# Patient Record
Sex: Male | Born: 1970
Health system: Southern US, Community
[De-identification: ages and names within clinical notes are randomized; demographics above are authoritative.]

## PROBLEM LIST (undated history)

## (undated) DIAGNOSIS — K219 Gastro-esophageal reflux disease without esophagitis: Secondary | ICD-10-CM

## (undated) DIAGNOSIS — E785 Hyperlipidemia, unspecified: Secondary | ICD-10-CM

## (undated) DIAGNOSIS — F419 Anxiety disorder, unspecified: Secondary | ICD-10-CM

## (undated) DIAGNOSIS — R509 Fever, unspecified: Secondary | ICD-10-CM

## (undated) DIAGNOSIS — L519 Erythema multiforme, unspecified: Principal | ICD-10-CM

## (undated) DIAGNOSIS — I10 Essential (primary) hypertension: Secondary | ICD-10-CM

## (undated) HISTORY — DX: Anxiety disorder, unspecified: F41.9

## (undated) HISTORY — DX: Erythema multiforme, unspecified: L51.9

## (undated) HISTORY — PX: WISDOM TOOTH EXTRACTION: SHX21

## (undated) HISTORY — DX: Fever, unspecified: R50.9

## (undated) HISTORY — DX: Hyperlipidemia, unspecified: E78.5

## (undated) HISTORY — PX: MOUTH SURGERY: SHX715

## (undated) HISTORY — DX: Gastro-esophageal reflux disease without esophagitis: K21.9

## (undated) HISTORY — DX: Essential (primary) hypertension: I10

## (undated) HISTORY — PX: EYE SURGERY: SHX253

---

## 2007-07-11 HISTORY — PX: LASIK: SHX215

## 2008-04-20 ENCOUNTER — Ambulatory Visit: Payer: Self-pay | Admitting: Internal Medicine

## 2008-10-19 ENCOUNTER — Ambulatory Visit: Payer: Self-pay | Admitting: Internal Medicine

## 2008-10-22 ENCOUNTER — Ambulatory Visit: Payer: Self-pay | Admitting: Internal Medicine

## 2008-10-22 ENCOUNTER — Encounter: Payer: Self-pay | Admitting: *Deleted

## 2008-10-22 DIAGNOSIS — E782 Mixed hyperlipidemia: Secondary | ICD-10-CM | POA: Insufficient documentation

## 2008-11-02 ENCOUNTER — Ambulatory Visit: Payer: Self-pay | Admitting: Internal Medicine

## 2008-12-29 ENCOUNTER — Ambulatory Visit: Payer: Self-pay | Admitting: Internal Medicine

## 2009-01-04 ENCOUNTER — Ambulatory Visit: Payer: Self-pay | Admitting: Cardiovascular Disease

## 2009-01-04 LAB — CONVERTED CEMR LAB
ALT: 37 units/L (ref 0–53)
Albumin: 4.2 g/dL (ref 3.5–5.2)
GFR calc non Af Amer: 100.95 mL/min (ref >60)
Glucose, Bld: 84 mg/dL (ref 70–99)
HDL: 57 mg/dL (ref >39.00)
Potassium: 4 meq/L (ref 3.5–5.1)
Sodium: 142 meq/L (ref 135–145)
Total Bilirubin: 1.2 mg/dL (ref 0.3–1.2)
Total Protein: 7.3 g/dL (ref 6.0–8.3)
Triglycerides: 198 mg/dL — ABNORMAL HIGH (ref 0.0–149.0)
VLDL: 39.6 mg/dL (ref 0.0–40.0)

## 2009-01-13 ENCOUNTER — Ambulatory Visit: Payer: Self-pay | Admitting: Internal Medicine

## 2009-04-23 ENCOUNTER — Ambulatory Visit: Payer: Self-pay | Admitting: Internal Medicine

## 2009-09-06 ENCOUNTER — Ambulatory Visit: Payer: Self-pay | Admitting: Internal Medicine

## 2009-12-17 ENCOUNTER — Ambulatory Visit: Payer: Self-pay | Admitting: Internal Medicine

## 2010-04-28 ENCOUNTER — Ambulatory Visit: Payer: Self-pay | Admitting: Internal Medicine

## 2010-05-16 ENCOUNTER — Ambulatory Visit: Payer: Self-pay | Admitting: Internal Medicine

## 2010-07-22 ENCOUNTER — Ambulatory Visit
Admission: RE | Admit: 2010-07-22 | Discharge: 2010-07-22 | Payer: Self-pay | Source: Home / Self Care | Attending: Internal Medicine | Admitting: Internal Medicine

## 2010-08-12 ENCOUNTER — Other Ambulatory Visit: Payer: Self-pay | Admitting: Internal Medicine

## 2010-08-18 ENCOUNTER — Other Ambulatory Visit: Payer: BC Managed Care – PPO | Admitting: Internal Medicine

## 2010-10-04 ENCOUNTER — Other Ambulatory Visit: Payer: Self-pay | Admitting: Cardiology

## 2010-10-06 ENCOUNTER — Other Ambulatory Visit: Payer: Self-pay | Admitting: *Deleted

## 2010-10-06 MED ORDER — ATORVASTATIN CALCIUM 40 MG PO TABS: 40.0000 mg | ORAL_TABLET | Freq: Every day | ORAL | Status: DC

## 2010-10-06 NOTE — Telephone Encounter (Signed)
[address] °

## 2011-04-12 ENCOUNTER — Other Ambulatory Visit: Payer: Self-pay

## 2011-04-12 MED ORDER — FENOFIBRATE 145 MG PO TABS: 145.0000 mg | ORAL_TABLET | Freq: Every day | ORAL | Status: DC

## 2011-04-28 ENCOUNTER — Other Ambulatory Visit: Payer: Self-pay

## 2011-04-28 MED ORDER — ZOLPIDEM TARTRATE 10 MG PO TABS: 10.0000 mg | ORAL_TABLET | Freq: Every evening | ORAL | Status: DC | PRN

## 2011-05-02 ENCOUNTER — Encounter: Payer: Self-pay | Admitting: Internal Medicine

## 2011-05-04 ENCOUNTER — Encounter: Payer: BC Managed Care – PPO | Admitting: Internal Medicine

## 2011-05-16 ENCOUNTER — Other Ambulatory Visit: Payer: Self-pay

## 2011-05-16 MED ORDER — ATORVASTATIN CALCIUM 40 MG PO TABS: 40.0000 mg | ORAL_TABLET | Freq: Every day | ORAL | Status: DC

## 2011-05-18 ENCOUNTER — Encounter: Payer: Self-pay | Admitting: Internal Medicine

## 2011-05-18 ENCOUNTER — Ambulatory Visit (INDEPENDENT_AMBULATORY_CARE_PROVIDER_SITE_OTHER): Payer: BC Managed Care – PPO | Admitting: Internal Medicine

## 2011-05-18 DIAGNOSIS — K219 Gastro-esophageal reflux disease without esophagitis: Secondary | ICD-10-CM

## 2011-05-18 DIAGNOSIS — Z79899 Other long term (current) drug therapy: Secondary | ICD-10-CM

## 2011-05-18 DIAGNOSIS — Z0000 Encounter for general adult medical examination without abnormal findings: Secondary | ICD-10-CM

## 2011-05-18 DIAGNOSIS — E785 Hyperlipidemia, unspecified: Secondary | ICD-10-CM

## 2011-05-18 LAB — POCT URINALYSIS DIPSTICK
Bilirubin, UA: NEGATIVE
Glucose, UA: NEGATIVE
Leukocytes, UA: NEGATIVE
Nitrite, UA: NEGATIVE
Urobilinogen, UA: NEGATIVE

## 2011-05-18 LAB — CBC WITH DIFFERENTIAL/PLATELET
Basophils Absolute: 0 10*3/uL (ref 0.0–0.1)
Lymphocytes Relative: 34 % (ref 12–46)
Neutro Abs: 2.2 10*3/uL (ref 1.7–7.7)
Platelets: 254 10*3/uL (ref 150–400)
RDW: 13 % (ref 11.5–15.5)
WBC: 4.3 10*3/uL (ref 4.0–10.5)

## 2011-05-18 MED ORDER — ATORVASTATIN CALCIUM 80 MG PO TABS: 80.0000 mg | ORAL_TABLET | Freq: Every day | ORAL | Status: DC

## 2011-05-19 LAB — LIPID PANEL
Cholesterol: 268 mg/dL — ABNORMAL HIGH (ref 0–200)
HDL: 60 mg/dL (ref >39)
LDL Cholesterol: 172 mg/dL — ABNORMAL HIGH (ref 0–99)
Total CHOL/HDL Ratio: 4.5 Ratio
Triglycerides: 178 mg/dL — ABNORMAL HIGH (ref <150)
VLDL: 36 mg/dL (ref 0–40)

## 2011-05-19 LAB — COMPREHENSIVE METABOLIC PANEL
ALT: 19 U/L (ref 0–53)
AST: 22 U/L (ref 0–37)
Albumin: 4.9 g/dL (ref 3.5–5.2)
Alkaline Phosphatase: 61 U/L (ref 39–117)
BUN: 8 mg/dL (ref 6–23)
CO2: 22 mEq/L (ref 19–32)
Calcium: 9.6 mg/dL (ref 8.4–10.5)
Chloride: 97 mEq/L (ref 96–112)
Creat: 1.07 mg/dL (ref 0.50–1.35)
Glucose, Bld: 89 mg/dL (ref 70–99)
Potassium: 4.5 mEq/L (ref 3.5–5.3)
Sodium: 134 mEq/L — ABNORMAL LOW (ref 135–145)
Total Bilirubin: 0.6 mg/dL (ref 0.3–1.2)
Total Protein: 7.9 g/dL (ref 6.0–8.3)

## 2011-06-13 ENCOUNTER — Encounter: Payer: Self-pay | Admitting: Internal Medicine

## 2011-06-13 DIAGNOSIS — K219 Gastro-esophageal reflux disease without esophagitis: Secondary | ICD-10-CM | POA: Insufficient documentation

## 2011-06-13 NOTE — Patient Instructions (Signed)
Continue Lipitor and TriCor. Consider cardiology evaluation in view of her father's family history. Try to diet and exercise more. Return in 6 months for fasting lipid panel liver functions and office visit. Continue Protonix for GE reflux.

## 2011-06-13 NOTE — Progress Notes (Signed)
  Subjective:    Patient ID: Daniel Weaver, male    DOB: 1971-06-20, 40 y.o.   MRN: [id number]  HPI 40 year old married white male with history of mixed hyperlipidemia on Lipitor 80 mg daily and TriCor 145 mg daily for health maintenance and evaluation of medical problems. History of GE reflux diagnosed February 2011 and has taken Protonix 40 mg daily for that. 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 Recently returned from [place]  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 His mother is healthy.  History of smoking for 12 years. Social alcohol consumption. Quit smoking 2005  2 half brothers in good health    Review of Systems  Constitutional: Negative.   HENT: Negative.   Eyes: Negative.   Respiratory: Negative.   Cardiovascular: Negative.   Gastrointestinal: Negative.   Genitourinary: Negative.   Musculoskeletal: Negative.   Neurological: Negative.   Hematological: Negative.   Psychiatric/Behavioral: Negative.        Objective:   Physical Exam  Vitals reviewed. Constitutional: He is oriented to person, place, and time. He appears well-nourished.  HENT:  Head: Normocephalic and atraumatic.  Right Ear: External ear normal.  Left Ear: External ear normal.  Mouth/Throat: Oropharynx is clear and moist.  Eyes: Conjunctivae and EOM are normal. Pupils are equal, round, and reactive to light. No scleral icterus.  Neck: Neck supple. No JVD present. No thyromegaly present.  Abdominal: Soft. Bowel sounds are normal. He exhibits no mass. There is no tenderness.  Genitourinary:       No hernias  Musculoskeletal: Normal range of motion.  Lymphadenopathy:    He has no cervical adenopathy.  Neurological:  He is alert and oriented to person, place, and time. He has normal reflexes. No cranial nerve deficit. Coordination normal.  Skin: Skin is warm and dry. He is not diaphoretic.  Psychiatric: He has a normal mood and affect. His behavior is normal.          Assessment & Plan:  GE reflux  Next hyperlipidemia  Plan patient really needs to watch his diet and get more exercise but he travels a fair male. Continue with Lipitor and TriCor. He may want to see cardiologist because of his recent discovery of coronary artery disease in his father.

## 2011-08-15 ENCOUNTER — Ambulatory Visit: Payer: BC Managed Care – PPO | Admitting: Internal Medicine

## 2011-11-14 ENCOUNTER — Other Ambulatory Visit: Payer: BC Managed Care – PPO | Admitting: Internal Medicine

## 2011-11-14 DIAGNOSIS — E785 Hyperlipidemia, unspecified: Secondary | ICD-10-CM

## 2011-11-14 DIAGNOSIS — Z79899 Other long term (current) drug therapy: Secondary | ICD-10-CM

## 2011-11-14 LAB — HEPATIC FUNCTION PANEL
ALT: 31 U/L (ref 0–53)
AST: 35 U/L (ref 0–37)
Indirect Bilirubin: 0.4 mg/dL (ref 0.0–0.9)
Total Protein: 7.4 g/dL (ref 6.0–8.3)

## 2011-11-14 LAB — LIPID PANEL
Cholesterol: 216 mg/dL — ABNORMAL HIGH (ref 0–200)
Triglycerides: 233 mg/dL — ABNORMAL HIGH (ref <150)
VLDL: 47 mg/dL — ABNORMAL HIGH (ref 0–40)

## 2011-11-16 ENCOUNTER — Ambulatory Visit (INDEPENDENT_AMBULATORY_CARE_PROVIDER_SITE_OTHER): Payer: BC Managed Care – PPO | Admitting: Internal Medicine

## 2011-11-16 ENCOUNTER — Encounter: Payer: Self-pay | Admitting: Internal Medicine

## 2011-11-16 VITALS — BP 136/90 | HR 92 | Wt 199.5 lb

## 2011-11-16 DIAGNOSIS — R030 Elevated blood-pressure reading, without diagnosis of hypertension: Secondary | ICD-10-CM

## 2011-11-16 DIAGNOSIS — E785 Hyperlipidemia, unspecified: Secondary | ICD-10-CM

## 2011-11-16 NOTE — Progress Notes (Signed)
  Subjective:    Patient ID: Daniel Weaver, male    DOB: 1971-03-06, 41 y.o.   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 Does a fair amount of domestic and foreign travel. Recently was in [place]. Patient in today for followup of hyperlipidemia. He is on Lipitor 80 mg daily and TriCor 145 mg daily. Despite these 2 medications cholesterol is not really at goal. He doesn't know if he has family history of hyperlipidemia. His mother does not. His real father is deceased of a heart problem. He was adopted by [employer]. Maternal grandfather with history of heart problems. Patient has been on lipid-lowering agents since 2004.  Patient used to smoke as documented in 2004 and at that time had smoked for 12 years. Social alcohol consumption consisting of beer. Plays golf for exercise.  Married with children. Graduate of [employer].    Review of Systems     Objective:   Physical Exam neck supple no carotid bruits; chest clear to auscultation; cardiac exam regular rate and rhythm.        Assessment & Plan:  Hyperlipidemia  Borderline hypertension  Family history of heart disease  Plan: Patient has seen Dr. Juanito Doom in the remote past. I'm going to refer him back to [facility] cardiology for consultation regarding his lipid management and evaluation of risk for heart disease. He does need to diet and exercise. Sometimes is not good about his diet. Will be seen here again in 6 months for physical examination. He'll be 41 years old in July.

## 2011-11-16 NOTE — Patient Instructions (Signed)
Continue TriCor and Lipitor. We will make appointment for you to see cardiologist. Return in 6 months for physical exam.

## 2011-11-27 ENCOUNTER — Encounter: Payer: Self-pay | Admitting: Cardiovascular Disease

## 2011-11-27 ENCOUNTER — Ambulatory Visit (INDEPENDENT_AMBULATORY_CARE_PROVIDER_SITE_OTHER): Payer: BC Managed Care – PPO | Admitting: Cardiovascular Disease

## 2011-11-27 VITALS — BP 128/76 | HR 112 | Resp 18 | Ht 71.0 in | Wt 197.8 lb

## 2011-11-27 DIAGNOSIS — R000 Tachycardia, unspecified: Secondary | ICD-10-CM

## 2011-11-27 DIAGNOSIS — E782 Mixed hyperlipidemia: Secondary | ICD-10-CM

## 2011-11-27 MED ORDER — ROSUVASTATIN CALCIUM 40 MG PO TABS: 40.0000 mg | ORAL_TABLET | Freq: Every day | ORAL | Status: DC

## 2011-11-27 NOTE — Progress Notes (Signed)
   History of Present Illness: 41 yo WM with history of hyperlipidemia, GERD who is referred today for evaluation of his hyperlipidemia and for cardiac risk assessment. He has been on statin since 2005. He is active. He plays golf regularly and walks. He has no chest pain or SOB. His biological father and maternal grandfather both had heart attacks in their 50s. He denies palpitations. He has been on Lipitor 80 mg po daily but his most recent LDL is still over 125. He denies taking Tricor. He thinks he is on a blood pressure medication but the record does not confirm this.   Primary Care Physician: Megan Salon Baxley  Last Lipid Profile:  Lipid Panel     Component Value Date/Time   CHOL 216* 11/14/2011 0927   TRIG 233* 11/14/2011 0927   HDL 43 11/14/2011 0927   CHOLHDL 5.0 11/14/2011 0927   VLDL 47* 11/14/2011 0927   LDLCALC 126* 11/14/2011 0927     Past Medical History  Diagnosis Date  . GERD (gastroesophageal reflux disease)   . Hyperlipidemia   . HTN (hypertension)     Past Surgical History  Procedure Date  . Lasik 2009    Current Outpatient Prescriptions  Medication Sig Dispense Refill  . atorvastatin (LIPITOR) 80 MG tablet Take 80 mg by mouth daily.        . cetirizine-pseudoephedrine (ZYRTEC-D) 5-120 MG per tablet Take 1 tablet by mouth 2 (two) times daily.      . fenofibrate (TRICOR) 145 MG tablet Take 1 tablet (145 mg total) by mouth daily.  30 tablet  11  . pantoprazole (PROTONIX) 40 MG tablet Take 40 mg by mouth daily.          No Known Allergies  History   Social History  . Marital Status: Married    Spouse Name: N/A    Number of Children: N/A  . Years of Education: N/A   Occupational History  . Not on file.   Social History Main Topics  . Smoking status: Former Smoker -- 0.5 packs/day for 15 years    Types: Cigarettes    Quit date: 05/17/2004  . Smokeless tobacco: Not on file   Comment: nicotine gum prn  . Alcohol Use: Yes     sociallly  . Drug Use: No  .  Sexually Active: Not on file   Other Topics Concern  . Not on file   Social History Narrative  . No narrative on file    No family history on file.  Review of Systems:  As stated in the HPI and otherwise negative.   BP 128/76  Pulse 112  Resp 18  Ht 5\' 11"  (1.803 m)  Wt 197 lb 12.8 oz (89.721 kg)  BMI 27.59 kg/m2  Physical Examination: General: Well developed, well nourished, NAD HEENT: OP clear, mucus membranes moist SKIN: warm, dry. No rashes. Neuro: No focal deficits Musculoskeletal: Muscle strength 5/5 all ext Psychiatric: Mood and affect normal Neck: No JVD, no carotid bruits, no thyromegaly, no lymphadenopathy. Lungs:Clear bilaterally, no wheezes, rhonci, crackles Cardiovascular: Tachy, regular rate and rhythm. No murmurs, gallops or rubs. Abdomen:Soft. Bowel sounds present. Non-tender.  Extremities: No lower extremity edema. Pulses are 2 + in the bilateral DP/PT.  EKG: Sinus tachycardia, rate 112 bpm.

## 2011-11-27 NOTE — Patient Instructions (Addendum)
Your physician recommends that you schedule a follow-up appointment in: about 14 weeks. (week or 2 after lab work)  Your physician has requested that you have an echocardiogram. Echocardiography is a painless test that uses sound waves to create images of your heart. It provides your doctor with information about the size and shape of your heart and how well your heart's chambers and valves are working. This procedure takes approximately one hour. There are no restrictions for this procedure.    Your physician recommends that you return for fasting lab work in:12 weeks--Lipid and Liver profile  Your physician has recommended you make the following change in your medication: Stop Atorvastatin. Start Crestor 40 mg by mouth daily

## 2011-11-27 NOTE — Assessment & Plan Note (Signed)
Will get echo to exclude structural heart disease, pericardial effusion.

## 2011-11-27 NOTE — Assessment & Plan Note (Addendum)
Will stop Lipitor and will start Crestor 40 mg po QHS. He will call back to let us know if he is taking Tricor. He will need lipids and LFTs in 12 weeks.

## 2011-11-28 ENCOUNTER — Telehealth: Payer: Self-pay | Admitting: Cardiovascular Disease

## 2011-11-28 ENCOUNTER — Other Ambulatory Visit: Payer: Self-pay

## 2011-11-28 MED ORDER — PANTOPRAZOLE SODIUM 40 MG PO TBEC: 40.0000 mg | DELAYED_RELEASE_TABLET | Freq: Every day | ORAL | Status: DC

## 2011-11-28 NOTE — Telephone Encounter (Signed)
Left message to call back  

## 2011-11-28 NOTE — Telephone Encounter (Signed)
Please return call to patient at [phone] regarding Tricor medication.

## 2011-11-29 NOTE — Telephone Encounter (Signed)
Spoke with pt and he reports he has been taking Tricor and he thought this was for hypertension. He will continue this.  He is not on a blood pressure medicine.

## 2011-11-30 NOTE — Telephone Encounter (Signed)
Thanks. chris 

## 2011-12-06 ENCOUNTER — Ambulatory Visit (HOSPITAL_COMMUNITY): Payer: BC Managed Care – PPO | Attending: Cardiology

## 2011-12-06 ENCOUNTER — Other Ambulatory Visit: Payer: Self-pay

## 2011-12-06 DIAGNOSIS — Z87891 Personal history of nicotine dependence: Secondary | ICD-10-CM | POA: Insufficient documentation

## 2011-12-06 DIAGNOSIS — E785 Hyperlipidemia, unspecified: Secondary | ICD-10-CM | POA: Insufficient documentation

## 2011-12-06 DIAGNOSIS — R000 Tachycardia, unspecified: Secondary | ICD-10-CM

## 2011-12-06 DIAGNOSIS — I10 Essential (primary) hypertension: Secondary | ICD-10-CM | POA: Insufficient documentation

## 2011-12-07 ENCOUNTER — Telehealth: Payer: Self-pay | Admitting: *Deleted

## 2011-12-07 NOTE — Telephone Encounter (Signed)
Normal echocardiogram results given to pt

## 2012-02-20 ENCOUNTER — Other Ambulatory Visit (INDEPENDENT_AMBULATORY_CARE_PROVIDER_SITE_OTHER): Payer: BC Managed Care – PPO

## 2012-02-20 DIAGNOSIS — E782 Mixed hyperlipidemia: Secondary | ICD-10-CM

## 2012-02-20 LAB — HEPATIC FUNCTION PANEL
ALT: 51 U/L (ref 0–53)
Albumin: 4.5 g/dL (ref 3.5–5.2)
Bilirubin, Direct: 0 mg/dL (ref 0.0–0.3)
Total Protein: 8 g/dL (ref 6.0–8.3)

## 2012-02-20 LAB — LIPID PANEL
Cholesterol: 195 mg/dL (ref 0–200)
HDL: 50.9 mg/dL (ref >39.00)
Triglycerides: 247 mg/dL — ABNORMAL HIGH (ref 0.0–149.0)

## 2012-02-27 ENCOUNTER — Telehealth: Payer: Self-pay | Admitting: *Deleted

## 2012-02-27 NOTE — Telephone Encounter (Signed)
I called pt to review lipid and liver lab results. He is presently out of the country and unable to talk for long. He will call our office back on March 04, 2012 when he returns for review of results and recommendations from Dr. Clifton James.  Pt has follow up appt with Dr. Clifton James on March 05, 2012 for follow up on lab results.  Per Dr. Clifton James if pt wants to proceed with lipid clinic appt he may cancel March 05, 2012 appt and follow up with lipid clinic regarding lipid management as long as he is not having any other problems.  Dr. Clifton James would like to see him back 6 months from initial office visit

## 2012-03-05 ENCOUNTER — Ambulatory Visit (INDEPENDENT_AMBULATORY_CARE_PROVIDER_SITE_OTHER): Payer: BC Managed Care – PPO | Admitting: Cardiovascular Disease

## 2012-03-05 ENCOUNTER — Encounter: Payer: Self-pay | Admitting: Cardiovascular Disease

## 2012-03-05 VITALS — BP 136/94 | HR 100 | Ht 71.0 in | Wt 197.0 lb

## 2012-03-05 DIAGNOSIS — R000 Tachycardia, unspecified: Secondary | ICD-10-CM

## 2012-03-05 DIAGNOSIS — E785 Hyperlipidemia, unspecified: Secondary | ICD-10-CM

## 2012-03-05 NOTE — Telephone Encounter (Signed)
Dr. Clifton James reviewed lab results with pt at office visit today. He does not need lipid clinic appt at this time.  Planned follow up appt with Dr. Clifton James in 12 months.

## 2012-03-05 NOTE — Progress Notes (Signed)
History of Present Illness: 41 yo WM with history of hyperlipidemia, GERD who is here today for cardiac follow up. I saw him May 20,2013 for evaluation of his hyperlipidemia and for cardiac risk assessment. He has been on statin since 2005. He is active. He plays golf regularly and walks. He has no chest pain or SOB. His biological father and maternal grandfather both had heart attacks in their 51s. He denies palpitations. He had been on Lipitor 80 mg po daily but his most recent LDL is still over 125. He was taking Tricor. I arranged an echo which was normal. I changed his Lipitor to Crestor 40 mg po QHS.   He is feeling great. No chest pain or SOB. He chews nicotine gum. No issues with his statin.   Primary Care Physician: Megan Salon Baxley   Last Lipid Profile:  Lipid Panel     Component Value Date/Time   CHOL 195 02/20/2012 0842   TRIG 247.0* 02/20/2012 0842   HDL 50.90 02/20/2012 0842   CHOLHDL 4 02/20/2012 0842   VLDL 49.4* 02/20/2012 0842   LDLCALC 126* 11/14/2011 0927     Past Medical History  Diagnosis Date  . GERD (gastroesophageal reflux disease)   . Hyperlipidemia   . HTN (hypertension)     Past Surgical History  Procedure Date  . Lasik 2009    Current Outpatient Prescriptions  Medication Sig Dispense Refill  . cetirizine-pseudoephedrine (ZYRTEC-D) 5-120 MG per tablet Take 1 tablet by mouth 2 (two) times daily.      . fenofibrate (TRICOR) 145 MG tablet Take 1 tablet (145 mg total) by mouth daily.  30 tablet  11  . pantoprazole (PROTONIX) 40 MG tablet Take 1 tablet (40 mg total) by mouth daily.  30 tablet  11  . rosuvastatin (CRESTOR) 40 MG tablet Take 1 tablet (40 mg total) by mouth daily.  30 tablet  6    No Known Allergies  History   Social History  . Marital Status: Married    Spouse Name: N/A    Number of Children: 2  . Years of Education: N/A   Occupational History  . makes insect repellant clothing    Social History Main Topics  . Smoking status:  Former Smoker -- 0.5 packs/day for 15 years    Types: Cigarettes    Quit date: 05/17/2004  . Smokeless tobacco: Not on file   Comment: nicotine gum prn  . Alcohol Use: 0.5 oz/week    1 drink(s) per week     sociallly  . Drug Use: No  . Sexually Active: Not on file   Other Topics Concern  . Not on file   Social History Narrative  . No narrative on file    Family History  Problem Relation Age of Onset  . Heart attack Maternal Grandfather 64  . Heart attack Father 40    Review of Systems:  As stated in the HPI and otherwise negative.   BP 136/94  Pulse 100  Ht 5\' 11"  (1.803 m)  Wt 197 lb (89.359 kg)  BMI 27.48 kg/m2  Physical Examination: General: Well developed, well nourished, NAD HEENT: OP clear, mucus membranes moist SKIN: warm, dry. No rashes. Neuro: No focal deficits Musculoskeletal: Muscle strength 5/5 all ext Psychiatric: Mood and affect normal Neck: No JVD, no carotid bruits, no thyromegaly, no lymphadenopathy. Lungs:Clear bilaterally, no wheezes, rhonci, crackles Cardiovascular: Regular rate and rhythm. No murmurs, gallops or rubs. Abdomen:Soft. Bowel sounds present. Non-tender.  Extremities: No lower  extremity edema. Pulses are 2 + in the bilateral DP/PT.  Echo: 12/06/11: Left ventricle: The cavity size was normal. Wall thickness was normal. Systolic function was normal. The estimated ejection fraction was in the range of 55% to 60%. Indeterminant diastolic function. Wall motion was normal; there were no regional wall motion abnormalities. - Aortic valve: There was no stenosis. - Mitral valve: No significant regurgitation. - Right ventricle: The cavity size was normal. Systolic function was normal. - Pulmonary arteries: No complete TR doppler jet so unable to estimate PA systolic pressure. - Inferior vena cava: The vessel was normal in size; the respirophasic diameter changes were in the normal range (= 50%); findings are consistent with normal  central venous pressure. Impressions:  - No significant abnormalities.    Assessment and Plan:   1. Tachycardia: No evidence of pericardial effusion. LV function is normal. No structural heart problems. Likely driven by his anxiety and nicotine gum.   2. Hyperlipidemia: LDL 119 on Crestor. Continue Tricor. Will need repeat lipids and LFTs in August 2014.

## 2012-03-05 NOTE — Patient Instructions (Addendum)
Your physician wants you to follow-up in:  12 months.  You will receive a reminder letter in the mail two months in advance. If you don't receive a letter, please call our office to schedule the follow-up appointment.   

## 2012-03-06 ENCOUNTER — Other Ambulatory Visit: Payer: Self-pay | Admitting: *Deleted

## 2012-03-06 DIAGNOSIS — E782 Mixed hyperlipidemia: Secondary | ICD-10-CM

## 2012-04-18 ENCOUNTER — Other Ambulatory Visit: Payer: Self-pay

## 2012-04-18 MED ORDER — ATOVAQUONE-PROGUANIL HCL 250-100 MG PO TABS: 1.0000 | ORAL_TABLET | Freq: Every day | ORAL | Status: DC

## 2012-04-18 MED ORDER — ZOLPIDEM TARTRATE 10 MG PO TABS: 10.0000 mg | ORAL_TABLET | Freq: Every evening | ORAL | Status: DC | PRN

## 2012-04-29 ENCOUNTER — Other Ambulatory Visit: Payer: Self-pay

## 2012-04-29 MED ORDER — FENOFIBRATE 145 MG PO TABS: 145.0000 mg | ORAL_TABLET | Freq: Every day | ORAL | Status: DC

## 2012-05-10 ENCOUNTER — Other Ambulatory Visit: Payer: Self-pay | Admitting: *Deleted

## 2012-05-10 DIAGNOSIS — E782 Mixed hyperlipidemia: Secondary | ICD-10-CM

## 2012-05-10 DIAGNOSIS — R030 Elevated blood-pressure reading, without diagnosis of hypertension: Secondary | ICD-10-CM

## 2012-05-14 ENCOUNTER — Other Ambulatory Visit: Payer: BC Managed Care – PPO | Admitting: Internal Medicine

## 2012-05-14 DIAGNOSIS — E785 Hyperlipidemia, unspecified: Secondary | ICD-10-CM

## 2012-05-14 DIAGNOSIS — Z0000 Encounter for general adult medical examination without abnormal findings: Secondary | ICD-10-CM

## 2012-05-14 LAB — LIPID PANEL
Cholesterol: 195 mg/dL (ref 0–200)
HDL: 53 mg/dL (ref >39)
Total CHOL/HDL Ratio: 3.7 Ratio
VLDL: 34 mg/dL (ref 0–40)

## 2012-05-14 LAB — CBC WITH DIFFERENTIAL/PLATELET
Basophils Absolute: 0 10*3/uL (ref 0.0–0.1)
Eosinophils Absolute: 0.2 10*3/uL (ref 0.0–0.7)
Eosinophils Relative: 5 % (ref 0–5)
Hemoglobin: 15.3 g/dL (ref 13.0–17.0)
Lymphs Abs: 1.8 10*3/uL (ref 0.7–4.0)
Monocytes Relative: 8 % (ref 3–12)
Neutro Abs: 2.4 10*3/uL (ref 1.7–7.7)
Neutrophils Relative %: 49 % (ref 43–77)
RBC: 5.05 MIL/uL (ref 4.22–5.81)

## 2012-05-14 LAB — COMPREHENSIVE METABOLIC PANEL
AST: 38 U/L — ABNORMAL HIGH (ref 0–37)
BUN: 9 mg/dL (ref 6–23)
Calcium: 10 mg/dL (ref 8.4–10.5)
Chloride: 101 mEq/L (ref 96–112)
Creat: 1.04 mg/dL (ref 0.50–1.35)
Total Bilirubin: 0.5 mg/dL (ref 0.3–1.2)

## 2012-05-16 ENCOUNTER — Ambulatory Visit (INDEPENDENT_AMBULATORY_CARE_PROVIDER_SITE_OTHER): Payer: BC Managed Care – PPO | Admitting: Internal Medicine

## 2012-05-16 ENCOUNTER — Encounter: Payer: Self-pay | Admitting: Internal Medicine

## 2012-05-16 VITALS — BP 130/86 | HR 92 | Temp 98.7°F | Ht 71.5 in | Wt 196.0 lb

## 2012-05-16 DIAGNOSIS — R030 Elevated blood-pressure reading, without diagnosis of hypertension: Secondary | ICD-10-CM

## 2012-05-16 DIAGNOSIS — H6590 Unspecified nonsuppurative otitis media, unspecified ear: Secondary | ICD-10-CM

## 2012-05-16 DIAGNOSIS — H6593 Unspecified nonsuppurative otitis media, bilateral: Secondary | ICD-10-CM

## 2012-05-16 DIAGNOSIS — Z23 Encounter for immunization: Secondary | ICD-10-CM

## 2012-05-16 DIAGNOSIS — Z0000 Encounter for general adult medical examination without abnormal findings: Secondary | ICD-10-CM

## 2012-05-16 DIAGNOSIS — E785 Hyperlipidemia, unspecified: Secondary | ICD-10-CM

## 2012-05-16 LAB — POCT URINALYSIS DIPSTICK
Ketones, UA: NEGATIVE
Protein, UA: NEGATIVE
Spec Grav, UA: <=1.005
pH, UA: 7.5

## 2012-05-16 NOTE — Progress Notes (Signed)
Subjective:    Patient ID: Daniel Weaver, male    DOB: 1970/07/12, 41 y.o.   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 Does a fair amount of domestic and foreign travel. Recently was at [facility] [place] and [place]. He takes Malarone for malaria prevention. Previously was on Lipitor 80 mg daily and TriCor 145 mg daily. I had him evaluated by cardiologist because of reported family history of heart disease in his father who is deceased. Patient learned father died of a heart problem. He was changed from Lipitor to Crestor 40 mg daily. It seems that his lipid panel has improved significantly from triglycerides of 240 two months ago to 170. Total cholesterol has stayed the same. His LDL cholesterol 6 months ago was 126 and is now 108. Patient says a cardiologist a 2-D echocardiogram.  The patient used to smoke in 2004 and at that time had smoked for 12 years. Social alcohol consumption consisting of beer. Plays golf for exercise. His been on lipid-lowering agents since 2004. Married with children. Wife teaches English to foreign students at [employer]. Graduate of [employer].  Additional family history: His mother is completely healthy without hyperlipidemia or hypertension.    Review of Systems  Constitutional: Negative.   HENT: Negative.   Eyes: Negative.   Respiratory: Negative.   Cardiovascular: Negative.   Gastrointestinal: Negative.   Genitourinary: Negative.   Musculoskeletal: Negative.   Neurological: Negative.        Objective:   Physical Exam  Vitals reviewed. Constitutional: He is oriented to person, place, and time. He appears well-developed and well-nourished. No distress.  HENT:  Head: Normocephalic and atraumatic.    Mouth/Throat: Oropharynx is clear and moist. No oropharyngeal exudate.       Both TMs are full bilaterally but not red  Eyes: Conjunctivae normal and EOM are normal. Pupils are equal, round, and reactive to light. Right eye exhibits no discharge. Left eye exhibits no discharge. No scleral icterus.  Neck: Normal range of motion. Neck supple. No JVD present. No thyromegaly present.  Cardiovascular: Normal rate, regular rhythm, normal heart sounds and intact distal pulses.   No murmur heard. Pulmonary/Chest: Effort normal and breath sounds normal. No respiratory distress. He has no wheezes. He has no rales.  Abdominal: Soft. Bowel sounds are normal. He exhibits no distension and no mass. There is no tenderness. There is no rebound and no guarding.  Genitourinary:       Deferred  Musculoskeletal: Normal range of motion. He exhibits no edema.  Neurological: He is alert and oriented to person, place, and time. He has normal reflexes. He displays normal reflexes. No cranial nerve deficit. Coordination normal.  Skin: Skin is warm and dry. No rash noted. He is not diaphoretic.  Psychiatric: He has a normal mood and affect. His behavior is normal. Judgment and thought content normal.          Assessment & Plan:  Patient recently seen the cardiologist and placed on Crestor 40 mg daily in place of generic Lipitor 80 mg daily. Improvement in triglycerides. He is also on TriCor for hypertriglyceridemia. He hasd cardiac evaluation and is to return to see cardiologist in August 2014. He is to return here in 6 months for office visit, lipid panel, liver functions. Influenza immunization given today. He  also has a bilateral serous otitis media and will be treated with a Zithromax Z-Pak.  Hyperlipidemia  Borderline hypertension- monitor at home  Bilateral serous otitis media-treat with Zithromax Z-PAK 2 tabs by mouth day one followed by 1 tab by mouth days 2 through 5  Plan: Return in 6 months for office  visit, lipid panel liver functions.

## 2012-05-16 NOTE — Patient Instructions (Addendum)
Continue Crestor, get home blood pressure monitor and return in 6 months. Check blood pressure at home. Take Zithromax Z-PAK for ear infection

## 2012-05-20 ENCOUNTER — Other Ambulatory Visit: Payer: BC Managed Care – PPO

## 2012-07-01 ENCOUNTER — Other Ambulatory Visit: Payer: Self-pay

## 2012-07-01 DIAGNOSIS — E782 Mixed hyperlipidemia: Secondary | ICD-10-CM

## 2012-07-01 MED ORDER — ROSUVASTATIN CALCIUM 40 MG PO TABS: 40.0000 mg | ORAL_TABLET | Freq: Every day | ORAL | Status: DC

## 2012-11-07 ENCOUNTER — Other Ambulatory Visit: Payer: Self-pay

## 2012-11-07 ENCOUNTER — Other Ambulatory Visit: Payer: Self-pay | Admitting: Internal Medicine

## 2012-11-07 MED ORDER — FENOFIBRATE 145 MG PO TABS: 145.0000 mg | ORAL_TABLET | Freq: Every day | ORAL | Status: DC

## 2012-11-21 ENCOUNTER — Other Ambulatory Visit: Payer: BC Managed Care – PPO | Admitting: Internal Medicine

## 2012-11-21 DIAGNOSIS — E785 Hyperlipidemia, unspecified: Secondary | ICD-10-CM

## 2012-11-21 DIAGNOSIS — Z79899 Other long term (current) drug therapy: Secondary | ICD-10-CM

## 2012-11-21 LAB — LIPID PANEL
LDL Cholesterol: 117 mg/dL — ABNORMAL HIGH (ref 0–99)
Total CHOL/HDL Ratio: 4.5 Ratio
VLDL: 44 mg/dL — ABNORMAL HIGH (ref 0–40)

## 2012-11-21 LAB — HEPATIC FUNCTION PANEL
Alkaline Phosphatase: 62 U/L (ref 39–117)
Indirect Bilirubin: 0.4 mg/dL (ref 0.0–0.9)
Total Bilirubin: 0.5 mg/dL (ref 0.3–1.2)

## 2012-11-22 ENCOUNTER — Encounter: Payer: Self-pay | Admitting: Internal Medicine

## 2012-11-22 ENCOUNTER — Ambulatory Visit (INDEPENDENT_AMBULATORY_CARE_PROVIDER_SITE_OTHER): Payer: BC Managed Care – PPO | Admitting: Internal Medicine

## 2012-11-22 VITALS — BP 138/84 | HR 92 | Wt 198.0 lb

## 2012-11-22 DIAGNOSIS — E782 Mixed hyperlipidemia: Secondary | ICD-10-CM

## 2012-11-22 MED ORDER — ROSUVASTATIN CALCIUM 40 MG PO TABS: 40.0000 mg | ORAL_TABLET | Freq: Every day | ORAL | Status: DC

## 2012-11-22 NOTE — Patient Instructions (Addendum)
Continue TriCor and Crestor at same dosages. Return in 6 months for physical exam

## 2012-11-22 NOTE — Progress Notes (Signed)
  Subjective:    Patient ID: Daniel Weaver, male    DOB: 1970/12/26, 42 y.o.   MRN: [id number]  HPI For 6 month follow up of hyperlipidemia. Has seen Cardiologist who switched him from Lipitor 80 mg daily to Crestor 40 mg daily. His echo was normal at that time. He chews 7-8 pieces of nicotine gum daily. He used to smoke. He is also on Tricor. Is under some stress at this time. BP a bit elevated today.  Lipid panel shows total cholesterol 207 and previously was 195 when checked   6 months ago. Triglycerides 218 and previously was 170 when checked  6 months ago. LDL cholesterol 117 and previously was 108 when checked 6 months ago.    Review of Systems     Objective:   Physical Exam Blood pressure rechecked 140/80. This will need to be monitored. Chest clear to auscultation. Cardiac exam regular rate and rhythm normal S1 and S2. Extremities without edema.        Assessment & Plan:  Mixed hyperlipidemia  History of smoking now uses nicotine gum  Situational stress  Plan: Patient will continue with current medications TriCor 145 mg daily and Crestor 40 mg daily. Crestor has been refilled for 6 months. Will  have physical examination before Thanksgiving 2014.

## 2012-12-05 ENCOUNTER — Other Ambulatory Visit: Payer: Self-pay

## 2012-12-05 ENCOUNTER — Other Ambulatory Visit: Payer: Self-pay | Admitting: Internal Medicine

## 2012-12-05 MED ORDER — PANTOPRAZOLE SODIUM 40 MG PO TBEC: 40.0000 mg | DELAYED_RELEASE_TABLET | Freq: Every day | ORAL | Status: DC

## 2013-05-23 ENCOUNTER — Other Ambulatory Visit: Payer: BC Managed Care – PPO | Admitting: Internal Medicine

## 2013-05-23 DIAGNOSIS — R030 Elevated blood-pressure reading, without diagnosis of hypertension: Secondary | ICD-10-CM

## 2013-05-23 DIAGNOSIS — E782 Mixed hyperlipidemia: Secondary | ICD-10-CM

## 2013-05-23 LAB — CBC WITH DIFFERENTIAL/PLATELET
Basophils Relative: 1 % (ref 0–1)
Eosinophils Absolute: 0.1 10*3/uL (ref 0.0–0.7)
Eosinophils Relative: 3 % (ref 0–5)
HCT: 45.3 % (ref 39.0–52.0)
Hemoglobin: 15.8 g/dL (ref 13.0–17.0)
MCH: 30.6 pg (ref 26.0–34.0)
MCHC: 34.9 g/dL (ref 30.0–36.0)
Monocytes Absolute: 0.4 10*3/uL (ref 0.1–1.0)
Monocytes Relative: 10 % (ref 3–12)

## 2013-05-23 LAB — COMPREHENSIVE METABOLIC PANEL
ALT: 62 U/L — ABNORMAL HIGH (ref 0–53)
AST: 58 U/L — ABNORMAL HIGH (ref 0–37)
Calcium: 9.6 mg/dL (ref 8.4–10.5)
Chloride: 98 mEq/L (ref 96–112)
Creat: 0.94 mg/dL (ref 0.50–1.35)
Potassium: 4.1 mEq/L (ref 3.5–5.3)
Sodium: 135 mEq/L (ref 135–145)

## 2013-05-23 LAB — LIPID PANEL
LDL Cholesterol: 123 mg/dL — ABNORMAL HIGH (ref 0–99)
VLDL: 58 mg/dL — ABNORMAL HIGH (ref 0–40)

## 2013-05-26 ENCOUNTER — Encounter: Payer: Self-pay | Admitting: Internal Medicine

## 2013-05-26 ENCOUNTER — Ambulatory Visit (INDEPENDENT_AMBULATORY_CARE_PROVIDER_SITE_OTHER): Payer: BC Managed Care – PPO | Admitting: Internal Medicine

## 2013-05-26 VITALS — BP 130/90 | HR 100 | Temp 97.8°F | Ht 71.0 in | Wt 200.0 lb

## 2013-05-26 DIAGNOSIS — IMO0001 Reserved for inherently not codable concepts without codable children: Secondary | ICD-10-CM

## 2013-05-26 DIAGNOSIS — E785 Hyperlipidemia, unspecified: Secondary | ICD-10-CM

## 2013-05-26 DIAGNOSIS — Z0000 Encounter for general adult medical examination without abnormal findings: Secondary | ICD-10-CM

## 2013-05-26 DIAGNOSIS — F411 Generalized anxiety disorder: Secondary | ICD-10-CM

## 2013-05-26 DIAGNOSIS — R030 Elevated blood-pressure reading, without diagnosis of hypertension: Secondary | ICD-10-CM

## 2013-05-26 DIAGNOSIS — R748 Abnormal levels of other serum enzymes: Secondary | ICD-10-CM

## 2013-05-26 LAB — POCT URINALYSIS DIPSTICK
Bilirubin, UA: NEGATIVE
Blood, UA: NEGATIVE
Nitrite, UA: NEGATIVE
pH, UA: 6.5

## 2013-05-26 NOTE — Patient Instructions (Signed)
Continue to monitor your blood pressure at home. Call if it remains substantially elevated. Continue lipid-lowering agents. Take Xanax sparingly for anxiety. Return in March for repeat lab work. Cut down on alcohol consumption and watch diet

## 2013-05-26 NOTE — Progress Notes (Signed)
Subjective:    Patient ID: Daniel Weaver, male    DOB: 12/15/1970, 42 y.o.   MRN: [id number]  HPI  42 year old White male for health maintenance and evaluation of hyperlipidemia.  Not traveling as much. No recent international travel. Is trying to buy a new house. He is under some stress with that. Closing date is December 5. Initially when he came in his blood pressure was 150/102 but came down rapidly to 130/90. He's not been following a low-fat diet. Lipid panel certainly reflects that. He has mild elevation of his liver functions as well. Says he been drinking a bit more alcohol. Had to be moved sometime in January 2015.  History of hyperlipidemia on statin therapy. Initially was on Lipitor 80 mg daily and TriCor 145 mg daily. She was evaluated by cardiologist because of reported family history of heart disease in his father who is deceased. Patient had learned his father died of a heart problem. Cardiologist changed him from Lipitor to Crestor. Lipid panel improved. Has been on lipid-lowering agents since 2004.  Patient used to smoke in 2004 and at that time had smoked for 12 years. Social alcohol consumption consisting of beer. Plays golf for exercise. He is married. Wife teaches English to foreign students that she [employer]. 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  Additional family history: His mother is completely healthy without medical issues.       Review of Systems     Objective:   Physical Exam  Vitals reviewed. Constitutional: He is oriented to person, place, and time. He appears well-developed and well-nourished. No distress.  HENT:  Head: Normocephalic and atraumatic.  Right Ear: External ear normal.  Left Ear: External ear normal.  Mouth/Throat: Oropharynx is clear  and moist. No oropharyngeal exudate.  Eyes: Conjunctivae and EOM are normal. Pupils are equal, round, and reactive to light. Right eye exhibits no discharge. Left eye exhibits no discharge. No scleral icterus.  Neck: Neck supple. No JVD present. No thyromegaly present.  Cardiovascular: Normal rate, regular rhythm, normal heart sounds and intact distal pulses.   No murmur heard. Pulmonary/Chest: Effort normal and breath sounds normal. No respiratory distress. He has no wheezes. He has no rales. He exhibits no tenderness.  Abdominal: Soft. Bowel sounds are normal. He exhibits no distension and no mass. There is no tenderness. There is no rebound and no guarding.  Genitourinary:  No hernias to direct palpation  Musculoskeletal: He exhibits no edema.  Lymphadenopathy:    He has no cervical adenopathy.  Neurological: He is alert and oriented to person, place, and time. He has normal reflexes. No cranial nerve deficit. Coordination normal.  Skin: Skin is warm and dry. No rash noted. He is not diaphoretic.  Psychiatric: He has a normal mood and affect. His behavior is normal. Judgment and thought content normal.          Assessment & Plan:  Hyperlipidemia-triglycerides, LDL cholesterol, and total cholesterol have gone up  Elevated liver function tests-possible fatty liver do to poor dietary habits alcohol consumption  Slightly low white blood cell count-etiology unclear but previously was normal  Anxiety related to buying  a house  Plan: Encouraged diet and exercise. Prescription for Xanax 0.5 mg per 6 #30) 1/2-1 by mouth twice a day when necessary anxiety to take  sparingly with no refill. In early March he will have repeat lipid panel liver functions and CBC to followup on these abnormal lab results. He has had flu vaccine elsewhere. Continue statin medication. I think the elevated liver functions are due to fatty liver from diet and alcohol consumption. Continue to monitor blood pressure

## 2013-06-20 ENCOUNTER — Other Ambulatory Visit: Payer: Self-pay | Admitting: *Deleted

## 2013-06-20 MED ORDER — ALPRAZOLAM 0.5 MG PO TABS: ORAL_TABLET | ORAL | Status: DC

## 2013-06-20 NOTE — Telephone Encounter (Signed)
Daniel Weaver called in for refill in Xanax 0.5 mg #30

## 2013-07-01 ENCOUNTER — Other Ambulatory Visit: Payer: Self-pay | Admitting: Internal Medicine

## 2013-09-11 ENCOUNTER — Other Ambulatory Visit: Payer: BC Managed Care – PPO | Admitting: Internal Medicine

## 2013-09-11 DIAGNOSIS — R6889 Other general symptoms and signs: Secondary | ICD-10-CM

## 2013-09-11 DIAGNOSIS — E785 Hyperlipidemia, unspecified: Secondary | ICD-10-CM

## 2013-09-11 DIAGNOSIS — Z79899 Other long term (current) drug therapy: Secondary | ICD-10-CM

## 2013-09-11 LAB — LIPID PANEL
CHOLESTEROL: 236 mg/dL — AB (ref 0–200)
HDL: 62 mg/dL (ref >39)
LDL Cholesterol: 135 mg/dL — ABNORMAL HIGH (ref 0–99)
TRIGLYCERIDES: 197 mg/dL — AB (ref <150)
Total CHOL/HDL Ratio: 3.8 Ratio
VLDL: 39 mg/dL (ref 0–40)

## 2013-09-11 LAB — HEPATIC FUNCTION PANEL
ALBUMIN: 5.2 g/dL (ref 3.5–5.2)
ALK PHOS: 63 U/L (ref 39–117)
ALT: 47 U/L (ref 0–53)
AST: 54 U/L — ABNORMAL HIGH (ref 0–37)
BILIRUBIN INDIRECT: 0.6 mg/dL (ref 0.2–1.2)
Bilirubin, Direct: 0.2 mg/dL (ref 0.0–0.3)
TOTAL PROTEIN: 8.3 g/dL (ref 6.0–8.3)
Total Bilirubin: 0.8 mg/dL (ref 0.2–1.2)

## 2013-09-11 LAB — CBC WITH DIFFERENTIAL/PLATELET
BASOS ABS: 0 10*3/uL (ref 0.0–0.1)
BASOS PCT: 1 % (ref 0–1)
Eosinophils Absolute: 0.3 10*3/uL (ref 0.0–0.7)
Eosinophils Relative: 6 % — ABNORMAL HIGH (ref 0–5)
HEMATOCRIT: 46 % (ref 39.0–52.0)
HEMOGLOBIN: 16 g/dL (ref 13.0–17.0)
LYMPHS PCT: 35 % (ref 12–46)
Lymphs Abs: 1.5 10*3/uL (ref 0.7–4.0)
MCH: 31.4 pg (ref 26.0–34.0)
MCHC: 34.8 g/dL (ref 30.0–36.0)
MCV: 90.2 fL (ref 78.0–100.0)
MONO ABS: 0.5 10*3/uL (ref 0.1–1.0)
Monocytes Relative: 11 % (ref 3–12)
NEUTROS ABS: 2 10*3/uL (ref 1.7–7.7)
NEUTROS PCT: 47 % (ref 43–77)
Platelets: 226 10*3/uL (ref 150–400)
RBC: 5.1 MIL/uL (ref 4.22–5.81)
RDW: 13.3 % (ref 11.5–15.5)
WBC: 4.2 10*3/uL (ref 4.0–10.5)

## 2013-09-22 ENCOUNTER — Ambulatory Visit (INDEPENDENT_AMBULATORY_CARE_PROVIDER_SITE_OTHER): Payer: BC Managed Care – PPO | Admitting: Internal Medicine

## 2013-09-22 ENCOUNTER — Encounter: Payer: Self-pay | Admitting: Internal Medicine

## 2013-09-22 VITALS — BP 152/100 | HR 100 | Temp 99.0°F | Wt 196.0 lb

## 2013-09-22 DIAGNOSIS — F411 Generalized anxiety disorder: Secondary | ICD-10-CM

## 2013-09-22 DIAGNOSIS — I10 Essential (primary) hypertension: Secondary | ICD-10-CM

## 2013-09-22 DIAGNOSIS — R748 Abnormal levels of other serum enzymes: Secondary | ICD-10-CM

## 2013-09-22 DIAGNOSIS — IMO0001 Reserved for inherently not codable concepts without codable children: Secondary | ICD-10-CM

## 2013-09-22 DIAGNOSIS — E785 Hyperlipidemia, unspecified: Secondary | ICD-10-CM

## 2013-09-22 MED ORDER — ALPRAZOLAM 0.5 MG PO TABS: ORAL_TABLET | ORAL | Status: DC

## 2013-09-22 NOTE — Patient Instructions (Signed)
Purchased a home blood pressure monitor and watch blood pressure. For remaining consistently elevated at home, please get back in touch with me. Otherwise but physical exam in 6 months. Recommend diet exercise and weight loss. Take Xanax sparingly for anxiety.

## 2013-09-22 NOTE — Progress Notes (Signed)
   Subjective:    Patient ID: Daniel StaggersMichael Jason Weaver, male    DOB: [birth year], 43 y.o.   MRN: [id number]  HPI  In today at my request discuss recent lipid panel. His blood pressure significantly elevated today at 152/100. He recently moved into a new home and has yet to sell his own home. Says she's under a lot of stress and has a lot of things going on. He recently had a fall down some steps. His sore in his right lower back. He is on TriCor and Crestor 40 mg daily. Despite this, LDL remains elevated. We talked about having a light post sides panel and going back to see cardiologist for evaluation. He wants to hold off on that at the present time. Says he would like to have a refill on Xanax. This helped anxiety quite a bit he hasn't taken it very ften but sparingly. He says his wife says it helps him. He says he feels anxious coming to the doctor. Says his palms get sweaty and he feels he has an element of office hypertension.    Review of Systems     Objective:   Physical Exam Skin warm and dry. Nodes none. Chest clear. Cardiac exam regular rate and rhythm. Carotids without edema.       Assessment & Plan:  ? Office hypertension  Anxiety  Hyperlipidemia-LDL cholesterol is 135 and previously was 123. Triglycerides have improved from 290-197. Total cholesterol was 236 and previously was 237.  Mild elevation of liver functions. Could be due to alcohol. SGOT was 54 and previously was 58. SGPT 47 and previously was 62.  Plan: Patient agrees to come in in 6 months physical examination. He will obtain a home blood pressure monitor and watch his blood pressure at home. However if blood pressures remain consistently elevated, he is to get back in touch with us in the next few weeks. Recommend diet exercise and weight loss. Have  Refilled  Xanax #60 0.5 mg 1 by mouth twice a day when necessary anxiety with no refill. Blood physical exam in 6 months. Order given for home blood pressure monitor.

## 2013-11-12 ENCOUNTER — Telehealth: Payer: Self-pay | Admitting: Internal Medicine

## 2013-11-12 NOTE — Telephone Encounter (Signed)
Going to [place] for work. Needs refill on Ambien. Also needs refill on Malarone. Should start Malarone 2 days before travel, continued during entire trip daily and continue for 7 days after returning from trip. He will let us know how many days he will be gone so we can figure out correctl number of tablets Will refill Ambien for 30 days only.

## 2013-11-13 DIAGNOSIS — Z0289 Encounter for other administrative examinations: Secondary | ICD-10-CM

## 2013-11-13 NOTE — Telephone Encounter (Signed)
Patient informed. Will be there 7 days.

## 2014-03-05 ENCOUNTER — Other Ambulatory Visit: Payer: Self-pay | Admitting: Internal Medicine

## 2014-06-01 ENCOUNTER — Other Ambulatory Visit: Payer: BC Managed Care – PPO | Admitting: Internal Medicine

## 2014-06-01 DIAGNOSIS — E782 Mixed hyperlipidemia: Secondary | ICD-10-CM

## 2014-06-01 DIAGNOSIS — R030 Elevated blood-pressure reading, without diagnosis of hypertension: Secondary | ICD-10-CM

## 2014-06-01 DIAGNOSIS — Z130 Encounter for screening for diseases of the blood and blood-forming organs and certain disorders involving the immune mechanism: Secondary | ICD-10-CM

## 2014-06-01 DIAGNOSIS — Z13220 Encounter for screening for lipoid disorders: Secondary | ICD-10-CM

## 2014-06-01 LAB — CBC WITH DIFFERENTIAL/PLATELET
BASOS ABS: 0 10*3/uL (ref 0.0–0.1)
Basophils Relative: 1 % (ref 0–1)
EOS PCT: 2 % (ref 0–5)
Eosinophils Absolute: 0.1 10*3/uL (ref 0.0–0.7)
HCT: 43.5 % (ref 39.0–52.0)
Hemoglobin: 15.1 g/dL (ref 13.0–17.0)
LYMPHS ABS: 0.9 10*3/uL (ref 0.7–4.0)
Lymphocytes Relative: 32 % (ref 12–46)
MCH: 31.8 pg (ref 26.0–34.0)
MCHC: 34.7 g/dL (ref 30.0–36.0)
MCV: 91.6 fL (ref 78.0–100.0)
MPV: 9.5 fL (ref 9.4–12.4)
Monocytes Absolute: 0.3 10*3/uL (ref 0.1–1.0)
Monocytes Relative: 12 % (ref 3–12)
Neutro Abs: 1.5 10*3/uL — ABNORMAL LOW (ref 1.7–7.7)
Neutrophils Relative %: 53 % (ref 43–77)
Platelets: 168 10*3/uL (ref 150–400)
RBC: 4.75 MIL/uL (ref 4.22–5.81)
RDW: 13.4 % (ref 11.5–15.5)
WBC: 2.9 10*3/uL — ABNORMAL LOW (ref 4.0–10.5)

## 2014-06-01 LAB — COMPREHENSIVE METABOLIC PANEL
ALT: 70 U/L — ABNORMAL HIGH (ref 0–53)
AST: 91 U/L — ABNORMAL HIGH (ref 0–37)
Albumin: 4.6 g/dL (ref 3.5–5.2)
Alkaline Phosphatase: 68 U/L (ref 39–117)
BILIRUBIN TOTAL: 0.6 mg/dL (ref 0.2–1.2)
BUN: 7 mg/dL (ref 6–23)
CO2: 23 meq/L (ref 19–32)
CREATININE: 0.84 mg/dL (ref 0.50–1.35)
Calcium: 9.2 mg/dL (ref 8.4–10.5)
Chloride: 93 mEq/L — ABNORMAL LOW (ref 96–112)
Glucose, Bld: 91 mg/dL (ref 70–99)
Potassium: 4.1 mEq/L (ref 3.5–5.3)
Sodium: 131 mEq/L — ABNORMAL LOW (ref 135–145)
Total Protein: 7 g/dL (ref 6.0–8.3)

## 2014-06-01 LAB — LIPID PANEL
CHOL/HDL RATIO: 3.3 ratio
Cholesterol: 209 mg/dL — ABNORMAL HIGH (ref 0–200)
HDL: 63 mg/dL (ref >39)
LDL Cholesterol: 122 mg/dL — ABNORMAL HIGH (ref 0–99)
TRIGLYCERIDES: 118 mg/dL (ref <150)
VLDL: 24 mg/dL (ref 0–40)

## 2014-06-02 ENCOUNTER — Ambulatory Visit (INDEPENDENT_AMBULATORY_CARE_PROVIDER_SITE_OTHER): Payer: BC Managed Care – PPO | Admitting: Internal Medicine

## 2014-06-02 ENCOUNTER — Encounter: Payer: Self-pay | Admitting: Internal Medicine

## 2014-06-02 VITALS — BP 154/100 | HR 122 | Temp 97.5°F | Ht 71.0 in | Wt 198.0 lb

## 2014-06-02 DIAGNOSIS — Z0000 Encounter for general adult medical examination without abnormal findings: Secondary | ICD-10-CM

## 2014-06-02 DIAGNOSIS — R030 Elevated blood-pressure reading, without diagnosis of hypertension: Secondary | ICD-10-CM

## 2014-06-02 DIAGNOSIS — J069 Acute upper respiratory infection, unspecified: Secondary | ICD-10-CM

## 2014-06-02 DIAGNOSIS — E785 Hyperlipidemia, unspecified: Secondary | ICD-10-CM

## 2014-06-02 DIAGNOSIS — R748 Abnormal levels of other serum enzymes: Secondary | ICD-10-CM

## 2014-06-02 DIAGNOSIS — Z23 Encounter for immunization: Secondary | ICD-10-CM

## 2014-06-02 DIAGNOSIS — IMO0001 Reserved for inherently not codable concepts without codable children: Secondary | ICD-10-CM

## 2014-06-02 DIAGNOSIS — K219 Gastro-esophageal reflux disease without esophagitis: Secondary | ICD-10-CM

## 2014-06-02 LAB — POCT URINALYSIS DIPSTICK
BILIRUBIN UA: NEGATIVE
Glucose, UA: NEGATIVE
KETONES UA: NEGATIVE
LEUKOCYTES UA: NEGATIVE
Nitrite, UA: NEGATIVE
PROTEIN UA: NEGATIVE
RBC UA: NEGATIVE
Urobilinogen, UA: NEGATIVE
pH, UA: 7

## 2014-06-02 MED ORDER — AZITHROMYCIN 250 MG PO TABS: ORAL_TABLET | ORAL | Status: DC

## 2014-06-02 NOTE — Patient Instructions (Addendum)
Decrease alcohol consumption and return late January for follow up of elevate liver functions and low WBC. Take Z-pak for URI.

## 2014-08-06 ENCOUNTER — Other Ambulatory Visit: Payer: BLUE CROSS/BLUE SHIELD | Admitting: Internal Medicine

## 2014-08-06 DIAGNOSIS — Z0000 Encounter for general adult medical examination without abnormal findings: Secondary | ICD-10-CM

## 2014-08-06 DIAGNOSIS — Z13220 Encounter for screening for lipoid disorders: Secondary | ICD-10-CM

## 2014-08-06 LAB — CBC WITH DIFFERENTIAL/PLATELET
BASOS ABS: 0 10*3/uL (ref 0.0–0.1)
BASOS PCT: 1 % (ref 0–1)
EOS ABS: 0.1 10*3/uL (ref 0.0–0.7)
EOS PCT: 3 % (ref 0–5)
HCT: 44.7 % (ref 39.0–52.0)
Hemoglobin: 15.5 g/dL (ref 13.0–17.0)
Lymphocytes Relative: 22 % (ref 12–46)
Lymphs Abs: 1 10*3/uL (ref 0.7–4.0)
MCH: 32.6 pg (ref 26.0–34.0)
MCHC: 34.7 g/dL (ref 30.0–36.0)
MCV: 94.1 fL (ref 78.0–100.0)
MONO ABS: 0.4 10*3/uL (ref 0.1–1.0)
MPV: 9.6 fL (ref 8.6–12.4)
Monocytes Relative: 9 % (ref 3–12)
Neutro Abs: 3.1 10*3/uL (ref 1.7–7.7)
Neutrophils Relative %: 65 % (ref 43–77)
PLATELETS: 224 10*3/uL (ref 150–400)
RBC: 4.75 MIL/uL (ref 4.22–5.81)
RDW: 13.2 % (ref 11.5–15.5)
WBC: 4.7 10*3/uL (ref 4.0–10.5)

## 2014-08-06 LAB — HEPATIC FUNCTION PANEL
ALT: 48 U/L (ref 0–53)
AST: 51 U/L — AB (ref 0–37)
Albumin: 4.7 g/dL (ref 3.5–5.2)
Alkaline Phosphatase: 70 U/L (ref 39–117)
BILIRUBIN INDIRECT: 0.5 mg/dL (ref 0.2–1.2)
BILIRUBIN TOTAL: 0.7 mg/dL (ref 0.2–1.2)
Bilirubin, Direct: 0.2 mg/dL (ref 0.0–0.3)
TOTAL PROTEIN: 7.7 g/dL (ref 6.0–8.3)

## 2014-08-06 LAB — LIPID PANEL
CHOL/HDL RATIO: 3.5 ratio
Cholesterol: 211 mg/dL — ABNORMAL HIGH (ref 0–200)
HDL: 60 mg/dL (ref >39)
LDL Cholesterol: 125 mg/dL — ABNORMAL HIGH (ref 0–99)
Triglycerides: 129 mg/dL (ref <150)
VLDL: 26 mg/dL (ref 0–40)

## 2014-08-07 ENCOUNTER — Encounter: Payer: Self-pay | Admitting: Internal Medicine

## 2014-08-07 ENCOUNTER — Ambulatory Visit (INDEPENDENT_AMBULATORY_CARE_PROVIDER_SITE_OTHER): Payer: BLUE CROSS/BLUE SHIELD | Admitting: Internal Medicine

## 2014-08-07 VITALS — BP 156/80 | HR 110 | Temp 97.9°F

## 2014-08-07 DIAGNOSIS — R7989 Other specified abnormal findings of blood chemistry: Secondary | ICD-10-CM

## 2014-08-07 DIAGNOSIS — R748 Abnormal levels of other serum enzymes: Secondary | ICD-10-CM

## 2014-08-07 DIAGNOSIS — E785 Hyperlipidemia, unspecified: Secondary | ICD-10-CM

## 2014-08-07 DIAGNOSIS — J069 Acute upper respiratory infection, unspecified: Secondary | ICD-10-CM

## 2014-08-09 NOTE — Patient Instructions (Addendum)
Continue same chronic meds and return in 6 months. Take Zithromax Z-PAK as directed. Monitor blood pressure at home.

## 2014-08-09 NOTE — Progress Notes (Signed)
   Subjective:    Patient ID: Daniel Weaver, male    DOB: Oct 31, 1970, 44 y.o.   MRN: [id number]  HPI  44 year old white male in today for follow-up of several medical issues. At last visit his white count was 2900 and is now 4700. He is on Crestor and fenofibrate for hyperlipidemia. Total cholesterol is 211 and was previously 209. LDL cholesterol is 125 and previously was 122. He also had a history of elevated liver functions at last visit with SGOT being 91 and is now 51. SGPT was 70 and is now 48. I think he was consuming a fair amount of alcohol and has tried to cut back a bit.  He also has today an acute respiratory infection with congestion. He started a Zithromax Z-Pak that he had been given previously.    Review of Systems     Objective:   Physical Exam  Skin warm and dry. Nodes none. Neck supple without JVD thyromegaly or carotid bruits. Chest clear. Cardiac exam regular rate and rhythm.      Assessment & Plan:  Hyperlipidemia-stable on 2 medications  Elevated liver functions-improved with decrease in alcohol consumption  Acute URI-patient started Zithromax of yesterday.  Low white blood cell count at last visit-now white blood cell count 4700  Elevated blood pressure-patient not feeling well today with respiratory infection. Needs to monitor blood pressure at home and call if persistently elevated.  Plan: Continue same medications and return in 6 months.

## 2014-09-05 NOTE — Progress Notes (Signed)
Subjective:    Patient ID: Daniel Weaver, male    DOB: 04/26/1971, 44 y.o.   MRN: [id number]  HPI 44 year old White Male in today for health maintenance exam and evaluation of medical issues including hyperlipidemia on statin therapy. Also on TriCor. History of hypertriglyceridemia. Saw cardiologist because of reported family history of heart disease in his father who is deceased. Patient had learned his father died of a heart problem. Cardiologist changed him from Lipitor to Crestor. Lipid panel improved. He's been on lipid-lowering agent since 2004.  Patient used to smoke in 2004 and at that time had smoked for 12 years. Social alcohol consumption. Lately that his increased because of some situational stress having to mortgages etc. He plays golf for exercise.   Social history: He is married. Wife teaches English to foreign students at [employer]. 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  Family history: As stated above father had history of heart disease. Mother is completely healthy without medical issues.  New issue today is acute respiratory infection. No fever or shaking chills. Nasal congestion with slight cough  Review of Systems  Constitutional: Negative.   All other systems reviewed and are negative.      Objective:   Physical Exam  Constitutional: He is oriented to person, place, and time. He appears well-developed and well-nourished. No distress.  HENT:  Head: Normocephalic and atraumatic.  Right Ear: External ear normal.  Left Ear: External ear normal.  Mouth/Throat: Oropharynx is clear and moist. No oropharyngeal exudate.  Eyes: Conjunctivae and EOM are normal. Pupils are equal, round, and reactive to light. Right eye exhibits no discharge. Left eye exhibits no discharge. No  scleral icterus.  Neck: Neck supple. No JVD present. No thyromegaly present.  Cardiovascular: Normal rate, regular rhythm, normal heart sounds and intact distal pulses.   No murmur heard. Pulmonary/Chest: Effort normal and breath sounds normal. No respiratory distress. He has no wheezes. He has no rales. He exhibits no tenderness.  Abdominal: Soft. Bowel sounds are normal. He exhibits no distension and no mass. There is no rebound and no guarding.  Genitourinary: Prostate normal.  Musculoskeletal: Normal range of motion. He exhibits no edema.  Lymphadenopathy:    He has no cervical adenopathy.  Neurological: He is alert and oriented to person, place, and time. He has normal reflexes. He displays normal reflexes. No cranial nerve deficit. Coordination normal.  Skin: Skin is warm and dry. No rash noted. He is not diaphoretic.  Psychiatric: He has a normal mood and affect. His behavior is normal. Judgment and thought content normal.  Vitals reviewed.         Assessment & Plan:  Hyperlipidemia-LDL cholesterol is 122 and total cholesterol 209. Triglycerides are normal.  Elevated liver functions-SGOT is 91 and SGPT is 70. He's cut back on alcohol consumption and reevaluate in 3 months  Anxiety-related to situational stress. Takes Xanax sparingly. Once again reminded him this does not mix well with alcohol  Excessive alcohol consumption-white blood cell count is 2900 which may be indicative of bone marrow suppression from excessive alcohol intake or acute upper respiratory infection. Talked with him about this today. He needs to cut back on alcohol consumption and return for follow-up January  Acute URI-treat with Zithromax Z-PAK  Insomnia-takes Ambien on trips    Plan: Return January for follow-up  with CBC and liver functions to be repeated

## 2014-10-28 ENCOUNTER — Other Ambulatory Visit: Payer: Self-pay | Admitting: Internal Medicine

## 2014-12-01 ENCOUNTER — Telehealth: Payer: Self-pay | Admitting: *Deleted

## 2014-12-01 DIAGNOSIS — F419 Anxiety disorder, unspecified: Secondary | ICD-10-CM

## 2014-12-01 MED ORDER — DIAZEPAM 10 MG PO TABS: ORAL_TABLET | ORAL | Status: DC

## 2014-12-01 NOTE — Telephone Encounter (Signed)
Wife has questions prior to her husbands admission to rehab for alcohol. She can be reached at [phone]. Has some anxiety issues wants something to calm him prior to airline flight tomorrow. He is still drinking alcohol. He drank last night and will probably drink tonight . Rehab is located in [place] and she is concerned he will be too anxious to board the plane.

## 2014-12-01 NOTE — Telephone Encounter (Signed)
Notified patient wife script called to pharmacy

## 2014-12-01 NOTE — Telephone Encounter (Signed)
Call in Valium 10 mg #6 one half to one po tid prn anxiety

## 2014-12-15 ENCOUNTER — Other Ambulatory Visit: Payer: BLUE CROSS/BLUE SHIELD | Admitting: Internal Medicine

## 2014-12-17 ENCOUNTER — Ambulatory Visit: Payer: BLUE CROSS/BLUE SHIELD | Admitting: Internal Medicine

## 2015-01-08 ENCOUNTER — Ambulatory Visit: Payer: Self-pay | Admitting: Psychology

## 2015-01-25 ENCOUNTER — Other Ambulatory Visit: Payer: Self-pay | Admitting: Internal Medicine

## 2015-01-25 ENCOUNTER — Other Ambulatory Visit: Payer: BLUE CROSS/BLUE SHIELD | Admitting: Internal Medicine

## 2015-01-25 DIAGNOSIS — E782 Mixed hyperlipidemia: Secondary | ICD-10-CM

## 2015-01-25 LAB — HEPATIC FUNCTION PANEL
ALT: 25 U/L (ref 0–53)
AST: 26 U/L (ref 0–37)
Albumin: 4.4 g/dL (ref 3.5–5.2)
Alkaline Phosphatase: 52 U/L (ref 39–117)
Bilirubin, Direct: 0.1 mg/dL (ref 0.0–0.3)
Indirect Bilirubin: 0.4 mg/dL (ref 0.2–1.2)
TOTAL PROTEIN: 7.2 g/dL (ref 6.0–8.3)
Total Bilirubin: 0.5 mg/dL (ref 0.2–1.2)

## 2015-01-25 LAB — LIPID PANEL
CHOLESTEROL: 179 mg/dL (ref 0–200)
HDL: 55 mg/dL (ref >=40)
LDL CALC: 107 mg/dL — AB (ref 0–99)
Total CHOL/HDL Ratio: 3.3 Ratio
Triglycerides: 86 mg/dL (ref <150)
VLDL: 17 mg/dL (ref 0–40)

## 2015-01-25 NOTE — Progress Notes (Signed)
   Subjective:    Patient ID: Daniel StaggersMichael Jason Guirguis, male    DOB: [birth year], 44 y.o.   MRN: [id number]  HPI Lab draw today for CPE Thursday    Review of Systems     Objective:   Physical Exam        Assessment & Plan:

## 2015-01-26 ENCOUNTER — Ambulatory Visit (INDEPENDENT_AMBULATORY_CARE_PROVIDER_SITE_OTHER): Payer: BLUE CROSS/BLUE SHIELD | Admitting: Internal Medicine

## 2015-01-26 ENCOUNTER — Encounter: Payer: Self-pay | Admitting: Internal Medicine

## 2015-01-26 VITALS — BP 128/84 | HR 92 | Temp 97.7°F | Wt 194.0 lb

## 2015-01-26 DIAGNOSIS — E785 Hyperlipidemia, unspecified: Secondary | ICD-10-CM | POA: Diagnosis not present

## 2015-01-26 DIAGNOSIS — R030 Elevated blood-pressure reading, without diagnosis of hypertension: Secondary | ICD-10-CM

## 2015-01-26 DIAGNOSIS — F1010 Alcohol abuse, uncomplicated: Secondary | ICD-10-CM | POA: Diagnosis not present

## 2015-01-26 MED ORDER — GABAPENTIN 400 MG PO CAPS: 400.0000 mg | ORAL_CAPSULE | Freq: Three times a day (TID) | ORAL | Status: DC

## 2015-01-26 MED ORDER — TRAZODONE HCL 100 MG PO TABS: 100.0000 mg | ORAL_TABLET | Freq: Every day | ORAL | Status: DC

## 2015-01-26 NOTE — Progress Notes (Signed)
   Subjective:    Patient ID: Daniel StaggersMichael Jason Weaver, male    DOB: February 03, 1971, 44 y.o.   MRN: [id number]  HPI  44 year old Male in today for follow-up on hyperlipidemia. Recently completed a 28 day stay at a rehabilitation facility in [place] after getting a DUI in late June. Physician at rehabilitation facility had him on Neurontin and trazodone which we have refilled today. He also was started on lisinopril. His blood pressure today is excellent. I like to try him off lisinopril since he's no longer drinking alcohol. His lipid panel has normalized on lipid-lowering medication since he's no longer drinking. He doesn't know if he has a family history of alcohol abuse in his father or not. Said he had a number of tests done at rehabilitation facility and that these tests did not indicate that he had any PTSD or depression. Some family therapy was also offered and his wife came out for that. He and his wife have seen a family counselor here as well. He is riding his bicycle to work at the present time.    Review of Systems     Objective:   Physical Exam Spent 25 minutes speaking with patient about these issues. He feels better. He is in the process of attending classes for DUI. He has not been to AA since coming back to [place]. I've given him the name of someone who can steer him in the right direction for AA meetings.       Assessment & Plan:  Hyperlipidemia-continue statin medication both Crestor and TriCor. He had total CK drawn in [place] which was normal  Alcohol abuse  Borderline hypertension  Plan: He'll return in 4 weeks off lisinopril. Continue lipid-lowering medication. Have refilled trazodone and Neurontin.

## 2015-01-26 NOTE — Patient Instructions (Signed)
Stop lisinopril. Continue Crestor and TriCor. Return in 4 weeks. Have refilled trazodone and Neurontin

## 2015-01-28 ENCOUNTER — Ambulatory Visit: Payer: Self-pay | Admitting: Internal Medicine

## 2015-02-02 ENCOUNTER — Other Ambulatory Visit: Payer: Self-pay | Admitting: Internal Medicine

## 2015-02-04 ENCOUNTER — Ambulatory Visit: Payer: Self-pay | Admitting: Internal Medicine

## 2015-02-22 ENCOUNTER — Other Ambulatory Visit: Payer: Self-pay | Admitting: Internal Medicine

## 2015-02-23 ENCOUNTER — Ambulatory Visit: Payer: BLUE CROSS/BLUE SHIELD | Admitting: Internal Medicine

## 2015-03-02 ENCOUNTER — Ambulatory Visit (INDEPENDENT_AMBULATORY_CARE_PROVIDER_SITE_OTHER): Payer: BLUE CROSS/BLUE SHIELD | Admitting: Internal Medicine

## 2015-03-02 ENCOUNTER — Encounter: Payer: Self-pay | Admitting: Internal Medicine

## 2015-03-02 VITALS — BP 126/82 | HR 86 | Temp 97.7°F | Wt 194.5 lb

## 2015-03-02 DIAGNOSIS — F1021 Alcohol dependence, in remission: Secondary | ICD-10-CM

## 2015-03-02 DIAGNOSIS — E785 Hyperlipidemia, unspecified: Secondary | ICD-10-CM | POA: Diagnosis not present

## 2015-03-02 MED ORDER — TRAZODONE HCL 100 MG PO TABS: 100.0000 mg | ORAL_TABLET | Freq: Every day | ORAL | Status: DC

## 2015-03-03 ENCOUNTER — Encounter: Payer: Self-pay | Admitting: Internal Medicine

## 2015-03-03 NOTE — Patient Instructions (Signed)
Watch blood pressure at home with home blood pressure monitor. Call if readings are persistently elevated. Continue lipid-lowering medication. Continue trazodone. Try to taper gabapentin.

## 2015-03-03 NOTE — Progress Notes (Signed)
   Subjective:    Patient ID: Daniel Weaver, male    DOB: 09-22-1970, 44 y.o.   MRN: [id number]  HPI History of hypertension and hyperlipidemia. He is off anti-hypertensive medication. Was in [place] for rehabilitation for alcohol abuse almost 3 months ago. He was placed on gabapentin. He's not sure it's doing much for him at this point. Physician out there thought it would help with cravings for alcohol. He's asking about tapering off of it. Was told to be on it for 3-6 months. Single be 3 months since he started it. He was on it for month while he was there in rehabilitation. Suggested that he taper it slowly over the next month and see how things go. He went to [place] on a family vacation. There were several nights of drinking. His wife said he sulked because he was not able to drink but he did not drink at all. After the third night, he was a bit fatigued with all the drinking and just wanted to go to his room. Told him I was pleased that he was able to avoid alcohol under those circumstances.   Blood pressure is excellent today. It's possible his alcohol abuse was raising his blood pressure. He is going to continue to monitor blood pressure. Continues to need lipid-lowering medication.    Review of Systems     Objective:   Physical Exam  Spent 25 minutes speaking with him about these issues. Was not examined.      Assessment & Plan:  History of elevated blood pressure-is to purchase home blood pressure monitor and keep an eye on his blood pressure.  Hyperlipidemia-continue same medications  History of alcohol abuse. Continue trazodone. Try to taper gabapentin.

## 2015-03-12 ENCOUNTER — Telehealth: Payer: Self-pay | Admitting: Internal Medicine

## 2015-03-12 MED ORDER — DIAZEPAM 10 MG PO TABS: ORAL_TABLET | ORAL | Status: DC

## 2015-03-12 NOTE — Telephone Encounter (Signed)
Call in Valium 10 mg #30 with no refill-Sig: one po  daily prn anxiety use sparingly

## 2015-03-12 NOTE — Telephone Encounter (Signed)
States you asked him to call back regarding his BP.  States this a.m. His BP was 126/83 prior to taking any meds.  Says has been been checking it every 2-3 days (Tuesday it was 116/78).  Says that in the past 2 weeks, it hasn't been above 130 systolic or over 88 diastolic.    Also, wants to know if you would consider giving him Diazepam prn.  He has been 99 days clean from alcohol -- but feels this may be helpful if/when he is around those who may be consuming alcohol.    Pharmacy:  [facility] [address] (where [facility] used to be).

## 2015-04-28 ENCOUNTER — Telehealth: Payer: Self-pay

## 2015-04-28 NOTE — Telephone Encounter (Signed)
Patients wife called concerned about patient being depressed.  She wanted to have something prescribed over the phone.  Advised her we could see patient in the office and could evaluate him.  Appointment scheduled.

## 2015-04-29 ENCOUNTER — Encounter: Payer: Self-pay | Admitting: Internal Medicine

## 2015-04-29 ENCOUNTER — Ambulatory Visit (INDEPENDENT_AMBULATORY_CARE_PROVIDER_SITE_OTHER): Payer: BLUE CROSS/BLUE SHIELD | Admitting: Internal Medicine

## 2015-04-29 VITALS — BP 126/86 | HR 84 | Resp 18 | Ht 71.0 in | Wt 189.0 lb

## 2015-04-29 DIAGNOSIS — Z23 Encounter for immunization: Secondary | ICD-10-CM

## 2015-04-29 DIAGNOSIS — F329 Major depressive disorder, single episode, unspecified: Secondary | ICD-10-CM

## 2015-04-29 DIAGNOSIS — F32A Depression, unspecified: Secondary | ICD-10-CM | POA: Insufficient documentation

## 2015-04-29 MED ORDER — ESCITALOPRAM OXALATE 10 MG PO TABS: 10.0000 mg | ORAL_TABLET | Freq: Every day | ORAL | Status: DC

## 2015-04-29 NOTE — Patient Instructions (Signed)
Flu vaccine given today. Start Lexapro 10 mg at bedtime and return in 4 weeks.

## 2015-04-29 NOTE — Progress Notes (Signed)
   Subjective:    Patient ID: Daniel StaggersMichael Jason Raggio, male    DOB: 01/01/1971, 44 y.o.   MRN: [id number]  HPI Back in May he received a DUI and went to rehabilitation at [facility] in [place]. He's done well. He's not been drinking for 147 days, he says. Initially thought he would lose his license for 6 months but there were some snafu [facility] and now it will be April before he can drive again. This is been depressing for him. He hasn't attorney that is helping him with the case. They do not feel that they can appeal the decision. He's been using a neighbor drive her to help him get around but that's expensive minutes caused him to be depressed. He has to think about how he will get around if he needs to go do something. He misses doing things with his kids without planning ahead with transportation issues. His wife is been very supportive. She recognizes he may be depressed and had him come in today. He denies any suicidal ideations. Just feels that there is no joy in living right nail.    Review of Systems     Objective:   Physical Exam  Spent 20 minutes with him speaking about these issues. He does appear to be depressed. His affect is a bit flat. His bit tearful in the office today speaking about the problem. He does not want to go to counseling at present time.      Assessment & Plan:  Depression  Plan: Lexapro 10 mg daily and reassess in 4 weeks.

## 2015-04-29 NOTE — Addendum Note (Signed)
Addended by: Dierdre ForthHURCH, Brea Coleson L on: 04/29/2015 10:48 AM   Modules accepted: Orders

## 2015-05-28 ENCOUNTER — Other Ambulatory Visit: Payer: Self-pay | Admitting: Internal Medicine

## 2015-05-31 ENCOUNTER — Ambulatory Visit (INDEPENDENT_AMBULATORY_CARE_PROVIDER_SITE_OTHER): Payer: BLUE CROSS/BLUE SHIELD | Admitting: Internal Medicine

## 2015-05-31 ENCOUNTER — Encounter: Payer: Self-pay | Admitting: Internal Medicine

## 2015-05-31 VITALS — BP 128/72 | HR 95 | Temp 98.0°F | Resp 20 | Wt 184.0 lb

## 2015-05-31 DIAGNOSIS — F329 Major depressive disorder, single episode, unspecified: Secondary | ICD-10-CM | POA: Diagnosis not present

## 2015-05-31 DIAGNOSIS — F32A Depression, unspecified: Secondary | ICD-10-CM

## 2015-05-31 MED ORDER — ESCITALOPRAM OXALATE 20 MG PO TABS: 20.0000 mg | ORAL_TABLET | Freq: Every day | ORAL | Status: DC

## 2015-05-31 NOTE — Progress Notes (Signed)
   Subjective:    Patient ID: Daniel Weaver, male    DOB: 1970-12-23, 44 y.o.   MRN: [id number]  HPI At last visit was started on Lexapro 10 mg daily for depression. He is feeling considerably better. Less anxious and worried. Attitude is better he says. Wonders if increasing dose would make him feel even more better. Leaving tomorrow to go [place] family to see his wife's family. He was depressed  At last visit because he found out he would not be driving as soon as he thought with pending DUI.    Review of Systems     Objective:   Physical Exam  Not examined. Spent 15 minutes speaking with him about these issues. His affect seems bright today. Not anxious or tearful.      Assessment & Plan:  Depression-improved  Plan: Increase Lexapro to 20 mg daily and call with progress report in 4-6 weeks. His last physical exam was November 2015. We checked on lipids in July 2016. Physical exam booked  for May 2017.

## 2015-05-31 NOTE — Patient Instructions (Signed)
Increase Lexapro to 20 mg daily. Call with progress report in 4 weeks. Physical exam booked for May 2017

## 2015-08-01 ENCOUNTER — Other Ambulatory Visit: Payer: Self-pay | Admitting: Internal Medicine

## 2015-08-06 ENCOUNTER — Other Ambulatory Visit: Payer: Self-pay | Admitting: Internal Medicine

## 2015-11-08 ENCOUNTER — Ambulatory Visit (INDEPENDENT_AMBULATORY_CARE_PROVIDER_SITE_OTHER): Payer: BLUE CROSS/BLUE SHIELD | Admitting: Internal Medicine

## 2015-11-08 ENCOUNTER — Encounter: Payer: Self-pay | Admitting: Internal Medicine

## 2015-11-08 VITALS — BP 172/104 | HR 99 | Temp 98.6°F | Resp 18 | Wt 188.5 lb

## 2015-11-08 DIAGNOSIS — S0081XA Abrasion of other part of head, initial encounter: Secondary | ICD-10-CM

## 2015-11-08 DIAGNOSIS — F1010 Alcohol abuse, uncomplicated: Secondary | ICD-10-CM | POA: Diagnosis not present

## 2015-11-08 MED ORDER — CHLORDIAZEPOXIDE HCL 25 MG PO CAPS: 25.0000 mg | ORAL_CAPSULE | Freq: Three times a day (TID) | ORAL | Status: DC | PRN

## 2015-11-10 ENCOUNTER — Encounter: Payer: Self-pay | Admitting: Internal Medicine

## 2015-11-10 NOTE — Progress Notes (Signed)
   Subjective:    Patient ID: Daniel Weaver, male    DOB: 1971/04/18, 45 y.o.   MRN: [id number]  HPI Patient in today accompanied by his wife. He wants to discuss detox from alcohol. He has a Veterinary surgeoncounselor here in town that he's been speaking with for some time. Patient has set quit date for today. This first time I have heard about his quit date. He wants me to give him benzodiazepines for detox. Have talked with patient and wife at length. He was drinking heavily and even checked into a hotel sometime within the past 2 weeks. His wife had a hard time finding him. He was intoxicated at the time. Recently he was intoxicated at home and fell down outside. This occurred over the weekend. He suffered an abrasion to left cheek. This is superficial. Wife called behavioral health center here in town and [facility] they did not do detox. I called behavioral health and indicated they did not do detox and less patient was suicidal. Patient is not suicidal. In the Spring of 2016 he was in [place] at a rehabilitation facility for about a month for alcohol abuse.  Subsequently called [facility] and they indicated patient would need to be admitted through emergency department for consideration of detox. It is late in the day and we have decided that he will go over there tomorrow morning for evaluation. I have given wife 6 x 25 mg Librium tablets to keep in her possession and use if necessary overnight if he becomes agitated and appears to be going into delirium tremens. Wife is supportive.    Review of Systems see above     Objective:   Physical Exam  He is alert and oriented. He is not jittery. Not hallucinating. Has superficial abrasion which is [name] down left cheek without infection.      Assessment & Plan:  Superficial abrasion left cheek without infection  Alcohol abuse  Anxiety  Plan: See above regarding Librium prescription. Is to present to [facility] tomorrow  for evaluation.  45 minutes spent with patient and wife discussing the situation, making phone calls and counseling patient regarding alcohol withdrawal.

## 2015-11-16 ENCOUNTER — Ambulatory Visit (INDEPENDENT_AMBULATORY_CARE_PROVIDER_SITE_OTHER): Payer: BLUE CROSS/BLUE SHIELD | Admitting: Internal Medicine

## 2015-11-16 ENCOUNTER — Encounter: Payer: Self-pay | Admitting: Internal Medicine

## 2015-11-16 VITALS — BP 116/78 | HR 76 | Temp 97.1°F | Resp 18 | Ht 71.0 in | Wt 188.5 lb

## 2015-11-16 DIAGNOSIS — F32A Depression, unspecified: Secondary | ICD-10-CM

## 2015-11-16 DIAGNOSIS — F329 Major depressive disorder, single episode, unspecified: Secondary | ICD-10-CM

## 2015-11-16 DIAGNOSIS — E785 Hyperlipidemia, unspecified: Secondary | ICD-10-CM

## 2015-11-16 DIAGNOSIS — F1021 Alcohol dependence, in remission: Secondary | ICD-10-CM | POA: Diagnosis not present

## 2015-11-16 DIAGNOSIS — K219 Gastro-esophageal reflux disease without esophagitis: Secondary | ICD-10-CM | POA: Diagnosis not present

## 2015-11-18 ENCOUNTER — Other Ambulatory Visit: Payer: BLUE CROSS/BLUE SHIELD | Admitting: Internal Medicine

## 2015-11-19 ENCOUNTER — Encounter: Payer: BLUE CROSS/BLUE SHIELD | Admitting: Internal Medicine

## 2015-11-24 NOTE — Patient Instructions (Addendum)
Patient is to percent to [facility] tomorrow for inpatient rapid detox. Given Librium tablets to wife to use this evening if he becomes anxious and exhibiting signs of alcohol withdrawal.. Follow-up here in one week and patient to keep close follow-up with his counselor. Keep abrasion clean warm and dry. Apply antibacterial ointment over-the-counter.

## 2015-12-02 ENCOUNTER — Ambulatory Visit: Payer: BLUE CROSS/BLUE SHIELD | Admitting: Internal Medicine

## 2015-12-04 DIAGNOSIS — F1021 Alcohol dependence, in remission: Secondary | ICD-10-CM | POA: Insufficient documentation

## 2015-12-04 NOTE — Patient Instructions (Signed)
Continue current medications. Continue to see counselor and attend AA meetings. Return in 2 weeks.

## 2015-12-04 NOTE — Progress Notes (Signed)
   Subjective:    Patient ID: Meredith StaggersMichael Jason Kinnett, male    DOB: March 07, 1971, 45 y.o.   MRN: [id number]  HPI 45 year old White Male with history of alcohol abuse. He was here May 1 having set a quit drinking date that very day. We explored very his options for him with regard to detox. It was decided he would go to [facility] following day for evaluation.  He previously was admitted to  [facility] [place] for alcohol rehabilitation in the Spring of 2016. He is now seeing a local therapist. Was drinking 8 or 9 glasses of wine a day prior to entering detox facility. He has received a DUI violation about a year ago. He had been riding his bike to work after that.  History of hyperlipidemia. On May 1 his blood pressure was significantly elevated at 172/104 but he was anxious. No prior history of hypertension. He has been maintained on Lexapro as a mood stabilizer for some time. History of GE reflux treated with PPI.  He was admitted to [facility] on May 2, evaluated by nurse practitioner and Dr. Jeannine KittenFarah. Was started on a Valium detox regimen. Was maintained on Haldol, Geodon and Ativan when necessary. Was given trazodone for sleep. Nicotine patch was provided. Was discharged May 4. Discharge medications included Lexapro 20 mg daily, and Enlyte capsule daily, TriCor 145 mg daily, Protonix 40 mg daily, Crestor 40 mg daily. Diagnosed with alcohol dependence and depression. Was felt to be stable for discharge. Will continue to see local counselor and is going to [employer] meetings.  Social history: Married. 2 children, 44-year-old son and 44 year old daughter. Wife is supportive. Smokes 15 cigarettes daily. Denies illicit drug use.  No known drug allergies  Review of Systems no new complaints. Abrasion left side of face has healed     Objective:   Physical Exam He looks alert and affect has improved. Chest clear. Cardiac exam regular rate and rhythm.  Extremities without edema. No evidence of delusions or tremors.       Assessment & Plan:  Alcohol dependence  Hyperlipidemia  Depression  GE reflux  Plan: Continue current medications. Return in 2 weeks. Continue to see local counselor and attend AA meetings.

## 2015-12-09 ENCOUNTER — Ambulatory Visit (INDEPENDENT_AMBULATORY_CARE_PROVIDER_SITE_OTHER): Payer: BLUE CROSS/BLUE SHIELD | Admitting: Internal Medicine

## 2015-12-09 ENCOUNTER — Encounter: Payer: Self-pay | Admitting: Internal Medicine

## 2015-12-09 VITALS — BP 124/76 | HR 80 | Temp 98.6°F | Resp 18 | Ht 71.0 in | Wt 190.5 lb

## 2015-12-09 DIAGNOSIS — F1021 Alcohol dependence, in remission: Secondary | ICD-10-CM

## 2015-12-22 ENCOUNTER — Telehealth: Payer: Self-pay | Admitting: Internal Medicine

## 2015-12-22 NOTE — Telephone Encounter (Signed)
Wife called to R/S his appointment for 6/27 (3 week f/u); patient is currently inpatient at [facility].  He went 2 days after his last visit here and was checked in to IP therapy at [facility].    He will be here for F/U visit to see you on Thursday, 01/20/16 @ 12:00 noon.

## 2015-12-23 ENCOUNTER — Encounter: Payer: Self-pay | Admitting: Internal Medicine

## 2015-12-23 NOTE — Progress Notes (Signed)
   Subjective:    Patient ID: Daniel Weaver, male    DOB: 1971/01/30, 45 y.o.   MRN: [id number]  HPI 45 year old Male with history of alcoholism in recovery. Here today for follow-up. He was admitted [facility] for brief detox early May. Apparently was doing fairly well until recently. Wife left for the afternoon with children and he apparently went and bought some wine and drank it. Feeling a bit bad about his slip. Has spoken to counselor about this. His wife has made him promise that if slip happens again he will go to inpatient therapy. Has been going to some AA meetings.    Review of Systems     Objective:   Physical Exam  Not examined. Spent 20 minutes speaking with him about these issues.      Assessment & Plan:   Alcoholism in recovery  Plan: Continue Lexapro 20 mg daily. Follow-up June 27. Continue outpatient counseling.  Addendum: December 22, 2015 telephone call from wife to front office. Appointment canceled for June 27. He is in [facility]. Apparently was admitted 2 days after visit on June 9. Has follow-up appointment here mid July.

## 2015-12-23 NOTE — Patient Instructions (Signed)
Follow up late June. Continue to go to [employer] meetings and see counselor.

## 2016-01-04 ENCOUNTER — Ambulatory Visit: Payer: BLUE CROSS/BLUE SHIELD | Admitting: Internal Medicine

## 2016-01-14 ENCOUNTER — Other Ambulatory Visit: Payer: Self-pay

## 2016-01-14 MED ORDER — TRAZODONE HCL 100 MG PO TABS: 100.0000 mg | ORAL_TABLET | Freq: Every day | ORAL | Status: DC

## 2016-01-20 ENCOUNTER — Encounter: Payer: Self-pay | Admitting: Internal Medicine

## 2016-01-20 ENCOUNTER — Ambulatory Visit (INDEPENDENT_AMBULATORY_CARE_PROVIDER_SITE_OTHER): Payer: BLUE CROSS/BLUE SHIELD | Admitting: Internal Medicine

## 2016-01-20 VITALS — BP 124/80 | HR 66 | Temp 97.0°F | Resp 18 | Ht 71.0 in | Wt 194.0 lb

## 2016-01-20 DIAGNOSIS — F1021 Alcohol dependence, in remission: Secondary | ICD-10-CM | POA: Diagnosis not present

## 2016-02-05 NOTE — Progress Notes (Signed)
   Subjective:    Patient ID: Daniel Weaver, male    DOB: 1970-12-21, 45 y.o.   MRN: [id number]  HPI He has completed program at [facility]. While there, he met Daniel Weaver and was impressed by him. He's been going to [employer]. Seems to be doing much better. Felt that he got a lot out of program at [facility]. Michela Pitcher it was much different than the one he attended in [place]. He has no questions or concerns today. He is back working full-time. Wife is supportive.    Review of Systems see above     Objective:   Physical Exam Not examined. Spent 20 minutes speaking with him about these issues. Stressed importance of continuing [employer], following through with sponsor, and continued counseling.       Assessment & Plan:  History of alcohol abuse-now recovering alcoholic  Plan: Continue same medications and return in 4 months.

## 2016-02-05 NOTE — Patient Instructions (Addendum)
It was pleasure to see you today. Continue going to [employer] Return in 4 months.

## 2016-02-25 DIAGNOSIS — M25511 Pain in right shoulder: Secondary | ICD-10-CM | POA: Diagnosis not present

## 2016-04-24 ENCOUNTER — Other Ambulatory Visit: Payer: Self-pay | Admitting: *Deleted

## 2016-04-24 MED ORDER — FENOFIBRATE 145 MG PO TABS: 145.0000 mg | ORAL_TABLET | Freq: Every day | ORAL | 6 refills | Status: DC

## 2016-04-24 MED ORDER — ROSUVASTATIN CALCIUM 40 MG PO TABS: 40.0000 mg | ORAL_TABLET | Freq: Every day | ORAL | 6 refills | Status: DC

## 2016-04-24 MED ORDER — PANTOPRAZOLE SODIUM 40 MG PO TBEC: 40.0000 mg | DELAYED_RELEASE_TABLET | Freq: Every day | ORAL | 6 refills | Status: DC

## 2016-05-25 ENCOUNTER — Ambulatory Visit: Payer: BLUE CROSS/BLUE SHIELD | Admitting: Internal Medicine

## 2016-06-07 ENCOUNTER — Other Ambulatory Visit: Payer: Self-pay | Admitting: Internal Medicine

## 2016-06-07 MED ORDER — TRAZODONE HCL 100 MG PO TABS: 100.0000 mg | ORAL_TABLET | Freq: Every day | ORAL | 1 refills | Status: DC

## 2016-06-07 MED ORDER — ESCITALOPRAM OXALATE 20 MG PO TABS: 20.0000 mg | ORAL_TABLET | Freq: Every day | ORAL | 1 refills | Status: DC

## 2016-08-22 ENCOUNTER — Ambulatory Visit (INDEPENDENT_AMBULATORY_CARE_PROVIDER_SITE_OTHER): Payer: BLUE CROSS/BLUE SHIELD | Admitting: Internal Medicine

## 2016-08-22 ENCOUNTER — Encounter: Payer: Self-pay | Admitting: Internal Medicine

## 2016-08-22 VITALS — BP 130/90 | HR 120 | Temp 99.2°F | Ht 71.0 in | Wt 208.0 lb

## 2016-08-22 DIAGNOSIS — J111 Influenza due to unidentified influenza virus with other respiratory manifestations: Secondary | ICD-10-CM

## 2016-08-22 DIAGNOSIS — F1021 Alcohol dependence, in remission: Secondary | ICD-10-CM

## 2016-08-22 MED ORDER — LEVOFLOXACIN 500 MG PO TABS: 500.0000 mg | ORAL_TABLET | Freq: Every day | ORAL | 0 refills | Status: DC

## 2016-08-22 MED ORDER — OSELTAMIVIR PHOSPHATE 75 MG PO CAPS: 75.0000 mg | ORAL_CAPSULE | Freq: Two times a day (BID) | ORAL | 0 refills | Status: DC

## 2016-08-22 NOTE — Progress Notes (Signed)
   Subjective:    Patient ID: Meredith StaggersMichael Jason Ellis, male    DOB: 07-24-1970, 46 y.o.   MRN: [id number]  HPI 46 year old Male recovering alcoholic awakened this morning with myalgias fever and chills. Did not get flu vaccine this season. Slight cough. No significant sore throat. Remains active in AA and has a sponsor. Has not had an alcoholic drink in 4 months.    Review of Systems see above     Objective:   Physical Exam Skin warm and dry. Nodes none. Pharynx slightly injected without exudate. Neck is supple. TMs are clear. Chest clear to auscultation.       Assessment & Plan:  Influenza  Plan: Tamiflu 75 mg twice daily for 5 days. Levaquin 500 milligrams daily for 7 days.

## 2016-08-24 NOTE — Patient Instructions (Addendum)
Tylenol or Advil as needed for fever and myalgias. Rest and drink plenty of fluids. Tamiflu 75 mg twice daily for 5 days. Levaquin 500 milligrams daily for 7 days.

## 2016-12-12 ENCOUNTER — Telehealth: Payer: Self-pay

## 2016-12-12 MED ORDER — ESCITALOPRAM OXALATE 20 MG PO TABS: 20.0000 mg | ORAL_TABLET | Freq: Every day | ORAL | 1 refills | Status: DC

## 2016-12-12 NOTE — Telephone Encounter (Signed)
[facility] from [facility] in regards to a refill on Escitalopram 20 mg for patient. Medication was refilled per Dr. Beryle QuantBaxley's request. Sent 90 days with one refill.

## 2017-05-28 ENCOUNTER — Telehealth: Payer: Self-pay

## 2017-05-28 MED ORDER — FENOFIBRATE 145 MG PO TABS: 145.0000 mg | ORAL_TABLET | Freq: Every day | ORAL | 0 refills | Status: DC

## 2017-05-28 MED ORDER — ROSUVASTATIN CALCIUM 40 MG PO TABS: 40.0000 mg | ORAL_TABLET | Freq: Every day | ORAL | 0 refills | Status: DC

## 2017-05-28 MED ORDER — PANTOPRAZOLE SODIUM 40 MG PO TBEC: 40.0000 mg | DELAYED_RELEASE_TABLET | Freq: Every day | ORAL | 0 refills | Status: DC

## 2017-05-28 NOTE — Telephone Encounter (Signed)
[facility] in regards to a refill on Fenofibrate 145mg , Crestor 40mg , Protonix 40mg  for patient. Medication was refilled per Dr. Beryle QuantBaxley's request. Sent 30 days

## 2017-06-13 ENCOUNTER — Other Ambulatory Visit: Payer: Self-pay | Admitting: Internal Medicine

## 2017-06-13 DIAGNOSIS — Z79899 Other long term (current) drug therapy: Secondary | ICD-10-CM

## 2017-06-13 DIAGNOSIS — E782 Mixed hyperlipidemia: Secondary | ICD-10-CM

## 2017-06-19 ENCOUNTER — Other Ambulatory Visit: Payer: BLUE CROSS/BLUE SHIELD | Admitting: Internal Medicine

## 2017-06-21 ENCOUNTER — Ambulatory Visit (INDEPENDENT_AMBULATORY_CARE_PROVIDER_SITE_OTHER): Payer: BLUE CROSS/BLUE SHIELD | Admitting: Internal Medicine

## 2017-06-21 VITALS — BP 110/80 | HR 78 | Ht 71.0 in

## 2017-06-21 DIAGNOSIS — E782 Mixed hyperlipidemia: Secondary | ICD-10-CM | POA: Diagnosis not present

## 2017-06-21 DIAGNOSIS — Z79899 Other long term (current) drug therapy: Secondary | ICD-10-CM | POA: Diagnosis not present

## 2017-06-21 DIAGNOSIS — T23261A Burn of second degree of back of right hand, initial encounter: Secondary | ICD-10-CM

## 2017-06-21 DIAGNOSIS — Z23 Encounter for immunization: Secondary | ICD-10-CM | POA: Diagnosis not present

## 2017-06-21 LAB — HEPATIC FUNCTION PANEL
AG Ratio: 1.8 (calc) (ref 1.0–2.5)
ALKALINE PHOSPHATASE (APISO): 56 U/L (ref 40–115)
ALT: 26 U/L (ref 9–46)
AST: 23 U/L (ref 10–40)
Albumin: 4.6 g/dL (ref 3.6–5.1)
Bilirubin, Direct: 0.1 mg/dL (ref 0.0–0.2)
Globulin: 2.5 g/dL (calc) (ref 1.9–3.7)
Indirect Bilirubin: 0.5 mg/dL (calc) (ref 0.2–1.2)
Total Bilirubin: 0.6 mg/dL (ref 0.2–1.2)
Total Protein: 7.1 g/dL (ref 6.1–8.1)

## 2017-06-21 LAB — LIPID PANEL
CHOLESTEROL: 173 mg/dL (ref <200)
HDL: 40 mg/dL — AB (ref >40)
LDL Cholesterol (Calc): 112 mg/dL (calc) — ABNORMAL HIGH
Non-HDL Cholesterol (Calc): 133 mg/dL (calc) — ABNORMAL HIGH (ref <130)
TRIGLYCERIDES: 107 mg/dL (ref <150)
Total CHOL/HDL Ratio: 4.3 (calc) (ref <5.0)

## 2017-06-21 NOTE — Addendum Note (Signed)
Addended by: Artemis Loyal P on: 06/21/2017 11:43 AM   Modules accepted: Orders  

## 2017-06-21 NOTE — Progress Notes (Signed)
   Subjective:    Patient ID: Daniel StaggersMichael Jason Waddle, male    DOB: Jan 04, 1971, 46 y.o.   MRN: [id number]  HPI He is here today to follow-up on hyperlipidemia.  This summer he had some slips in alcohol recovery.  Continues to go to AA.  Feels that he is doing better more recently.  He has a first-degree burn on his right hand.  It seems to be healing well but he was given a tetanus immunization update.  With regard to hyperlipidemia which is why he was asked to come in, he has been on statin therapy for a number of years.  Total cholesterol is 173, he has a low HDL of 40 and an LDL of 112.  This is stable.  Liver functions are normal.  Since his business and marriage are going well.  He and his wife are in marital counseling at present.    Review of Systems see above     Objective:   Physical Exam  Neck supple.  Chest clear.  Cardiac exam regular rate and rhythm.  Extremities without edema.  Affect is normal.      Assessment & Plan:  Hyperlipidemia  First-degree burn right hand  Recovering alcoholic  Plan: Continue lipid-lowering medication.  He needs to have a physical exam in the next 6 months.

## 2017-06-21 NOTE — Addendum Note (Signed)
Addended by: Gregery NaVALENCIA, Vanessa Kampf P on: 06/21/2017 11:43 AM   Modules accepted: Orders

## 2017-06-21 NOTE — Patient Instructions (Signed)
Tetanus immunization update  Given. Has had flu vaccine. Labs drawn.

## 2017-06-29 ENCOUNTER — Other Ambulatory Visit: Payer: Self-pay

## 2017-06-29 MED ORDER — ROSUVASTATIN CALCIUM 40 MG PO TABS: 40.0000 mg | ORAL_TABLET | Freq: Every day | ORAL | 1 refills | Status: DC

## 2017-06-29 MED ORDER — PANTOPRAZOLE SODIUM 40 MG PO TBEC: 40.0000 mg | DELAYED_RELEASE_TABLET | Freq: Every day | ORAL | 1 refills | Status: DC

## 2017-06-29 MED ORDER — FENOFIBRATE 145 MG PO TABS: 145.0000 mg | ORAL_TABLET | Freq: Every day | ORAL | 1 refills | Status: DC

## 2017-06-29 MED ORDER — TRAZODONE HCL 100 MG PO TABS: 100.0000 mg | ORAL_TABLET | Freq: Every day | ORAL | 1 refills | Status: DC

## 2017-07-09 ENCOUNTER — Encounter: Payer: Self-pay | Admitting: Internal Medicine

## 2017-09-03 ENCOUNTER — Other Ambulatory Visit: Payer: Self-pay

## 2017-09-03 MED ORDER — ESCITALOPRAM OXALATE 20 MG PO TABS: 20.0000 mg | ORAL_TABLET | Freq: Every day | ORAL | 0 refills | Status: DC

## 2017-12-07 ENCOUNTER — Other Ambulatory Visit: Payer: Self-pay

## 2017-12-07 DIAGNOSIS — E782 Mixed hyperlipidemia: Secondary | ICD-10-CM

## 2017-12-07 DIAGNOSIS — Z0000 Encounter for general adult medical examination without abnormal findings: Secondary | ICD-10-CM

## 2017-12-19 ENCOUNTER — Other Ambulatory Visit: Payer: Self-pay

## 2017-12-25 ENCOUNTER — Other Ambulatory Visit: Payer: BLUE CROSS/BLUE SHIELD | Admitting: Internal Medicine

## 2017-12-27 ENCOUNTER — Encounter: Payer: BLUE CROSS/BLUE SHIELD | Admitting: Internal Medicine

## 2017-12-31 DIAGNOSIS — R0781 Pleurodynia: Secondary | ICD-10-CM | POA: Diagnosis not present

## 2018-01-04 ENCOUNTER — Other Ambulatory Visit: Payer: BLUE CROSS/BLUE SHIELD | Admitting: Internal Medicine

## 2018-01-14 ENCOUNTER — Telehealth: Payer: Self-pay | Admitting: Internal Medicine

## 2018-01-14 ENCOUNTER — Encounter: Payer: BLUE CROSS/BLUE SHIELD | Admitting: Internal Medicine

## 2018-01-14 MED ORDER — PANTOPRAZOLE SODIUM 40 MG PO TBEC: 40.0000 mg | DELAYED_RELEASE_TABLET | Freq: Every day | ORAL | 0 refills | Status: DC

## 2018-01-14 MED ORDER — ROSUVASTATIN CALCIUM 40 MG PO TABS: 40.0000 mg | ORAL_TABLET | Freq: Every day | ORAL | 0 refills | Status: DC

## 2018-01-14 NOTE — Telephone Encounter (Signed)
Refill each x 30 days

## 2018-01-14 NOTE — Telephone Encounter (Signed)
Had to R/S patient's CPE for today because he hasn't had his labs.  R/S labs for this Thursday.  He will have his CPE on Tuesday of next week.    Can we refill his Crestor and Protonix as he is out of both?    Pharmacy:  [facility]  Thank you.

## 2018-01-14 NOTE — Telephone Encounter (Signed)
Escribed

## 2018-01-15 ENCOUNTER — Other Ambulatory Visit: Payer: Self-pay | Admitting: Internal Medicine

## 2018-01-17 ENCOUNTER — Other Ambulatory Visit: Payer: BLUE CROSS/BLUE SHIELD | Admitting: Internal Medicine

## 2018-01-17 DIAGNOSIS — Z0000 Encounter for general adult medical examination without abnormal findings: Secondary | ICD-10-CM

## 2018-01-17 DIAGNOSIS — E782 Mixed hyperlipidemia: Secondary | ICD-10-CM | POA: Diagnosis not present

## 2018-01-17 LAB — CBC WITH DIFFERENTIAL/PLATELET
BASOS PCT: 0.5 %
Basophils Absolute: 37 cells/uL (ref 0–200)
EOS PCT: 2.9 %
Eosinophils Absolute: 212 cells/uL (ref 15–500)
HEMATOCRIT: 44.9 % (ref 38.5–50.0)
Hemoglobin: 15.5 g/dL (ref 13.2–17.1)
LYMPHS ABS: 1949 {cells}/uL (ref 850–3900)
MCH: 29.1 pg (ref 27.0–33.0)
MCHC: 34.5 g/dL (ref 32.0–36.0)
MCV: 84.2 fL (ref 80.0–100.0)
MPV: 10.2 fL (ref 7.5–12.5)
Monocytes Relative: 7.7 %
NEUTROS PCT: 62.2 %
Neutro Abs: 4541 cells/uL (ref 1500–7800)
PLATELETS: 264 10*3/uL (ref 140–400)
RBC: 5.33 10*6/uL (ref 4.20–5.80)
RDW: 12.5 % (ref 11.0–15.0)
Total Lymphocyte: 26.7 %
WBC: 7.3 10*3/uL (ref 3.8–10.8)
WBCMIX: 562 {cells}/uL (ref 200–950)

## 2018-01-17 LAB — LIPID PANEL
CHOL/HDL RATIO: 6.3 (calc) — AB (ref <5.0)
Cholesterol: 201 mg/dL — ABNORMAL HIGH (ref <200)
HDL: 32 mg/dL — ABNORMAL LOW (ref >40)
LDL CHOLESTEROL (CALC): 138 mg/dL — AB
NON-HDL CHOLESTEROL (CALC): 169 mg/dL — AB (ref <130)
Triglycerides: 179 mg/dL — ABNORMAL HIGH (ref <150)

## 2018-01-17 LAB — COMPLETE METABOLIC PANEL WITH GFR
AG Ratio: 1.7 (calc) (ref 1.0–2.5)
ALKALINE PHOSPHATASE (APISO): 93 U/L (ref 40–115)
ALT: 27 U/L (ref 9–46)
AST: 18 U/L (ref 10–40)
Albumin: 4.7 g/dL (ref 3.6–5.1)
BUN: 12 mg/dL (ref 7–25)
CO2: 25 mmol/L (ref 20–32)
CREATININE: 0.93 mg/dL (ref 0.60–1.35)
Calcium: 10.5 mg/dL — ABNORMAL HIGH (ref 8.6–10.3)
Chloride: 103 mmol/L (ref 98–110)
GFR, Est African American: 114 mL/min/{1.73_m2} (ref >=60)
GFR, Est Non African American: 99 mL/min/{1.73_m2} (ref >=60)
Globulin: 2.7 g/dL (calc) (ref 1.9–3.7)
Glucose, Bld: 108 mg/dL — ABNORMAL HIGH (ref 65–99)
Potassium: 4.4 mmol/L (ref 3.5–5.3)
SODIUM: 138 mmol/L (ref 135–146)
Total Bilirubin: 0.6 mg/dL (ref 0.2–1.2)
Total Protein: 7.4 g/dL (ref 6.1–8.1)

## 2018-01-22 ENCOUNTER — Encounter: Payer: Self-pay | Admitting: Internal Medicine

## 2018-01-22 ENCOUNTER — Ambulatory Visit (INDEPENDENT_AMBULATORY_CARE_PROVIDER_SITE_OTHER): Payer: BLUE CROSS/BLUE SHIELD | Admitting: Internal Medicine

## 2018-01-22 VITALS — BP 140/110 | HR 103 | Ht 71.0 in | Wt 207.0 lb

## 2018-01-22 DIAGNOSIS — E782 Mixed hyperlipidemia: Secondary | ICD-10-CM | POA: Diagnosis not present

## 2018-01-22 DIAGNOSIS — K219 Gastro-esophageal reflux disease without esophagitis: Secondary | ICD-10-CM | POA: Diagnosis not present

## 2018-01-22 DIAGNOSIS — F1021 Alcohol dependence, in remission: Secondary | ICD-10-CM

## 2018-01-22 DIAGNOSIS — Z0000 Encounter for general adult medical examination without abnormal findings: Secondary | ICD-10-CM

## 2018-01-22 LAB — POCT URINALYSIS DIPSTICK
APPEARANCE: NORMAL
BILIRUBIN UA: NEGATIVE
GLUCOSE UA: NEGATIVE
KETONES UA: NEGATIVE
Leukocytes, UA: NEGATIVE
Nitrite, UA: NEGATIVE
Odor: NORMAL
Protein, UA: NEGATIVE
RBC UA: NEGATIVE
Spec Grav, UA: 1.01 (ref 1.010–1.025)
UROBILINOGEN UA: 0.2 U/dL
pH, UA: 7.5 (ref 5.0–8.0)

## 2018-01-22 MED ORDER — FENOFIBRATE 145 MG PO TABS: 145.0000 mg | ORAL_TABLET | Freq: Every day | ORAL | 1 refills | Status: DC

## 2018-01-22 MED ORDER — ROSUVASTATIN CALCIUM 40 MG PO TABS: 40.0000 mg | ORAL_TABLET | Freq: Every day | ORAL | 0 refills | Status: DC

## 2018-01-22 NOTE — Patient Instructions (Signed)
Make sure to take both lipid lowering meds daily and RTC in 4 weeks for recheck of labs and OV

## 2018-01-22 NOTE — Progress Notes (Signed)
   Subjective:    Patient ID: Daniel Weaver, male    DOB: 1971/01/15, 47 y.o.   MRN: [id number]  HPI 47 year old Male for health maintenance exam and evaluation of medical issues.  History of hyperlipidemia long-standing likely inherited treated with TriCor and Crestor.  History of GE reflux treated with Protonix.  Patient is on  Desyrel100 mg at bedtime.  He has been on lipid-lowering agents since 2004.  In 2004 it was noted that he had smoked for 12 years.  He quit smoking subsequently.  Social history: He is married.  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  Family history: He learned a few years ago that his father had a history of heart disease and died of heart problem.  Mother with history of stroke and history of hypertension as well as hyperlipidemia  Patient saw a Cardiologist several years ago because of his family history and was changed from Lipitor to Crestor.  Today lipid panel is abnormal.  Total cholesterol was 201, triglycerides 179 and LDL cholesterol 138.  His triglycerides were normal several months ago.  It seems that he may not have been taking both lipid medications regularly.  He will return in a month for follow-up.  His calcium is mildly elevated at 10.5 and can be repeated at that time.  He attends AA meetings regularly and has a sponsor.  He has had admissions to 2 alcohol detox centers- one in [place] and at [facility] here in [place].  Review of Systems no new complaints     Objective:   Physical Exam  Constitutional: He is oriented to person, place, and time. He appears well-developed and well-nourished. No distress.  HENT:  Head: Normocephalic and atraumatic.  Right Ear: External ear normal.  Left Ear: External ear normal.  Mouth/Throat: Oropharynx is clear and moist.  Eyes: Pupils are equal, round, and reactive to light. EOM are normal. Right eye  exhibits no discharge. Left eye exhibits no discharge. No scleral icterus.  Neck: No JVD present. No thyromegaly present.  Cardiovascular: Normal rate, regular rhythm and normal heart sounds. Exam reveals no friction rub.  No murmur heard. Pulmonary/Chest: Effort normal. No stridor. No respiratory distress. He has no wheezes. He has no rales.  Abdominal: Soft. Bowel sounds are normal. He exhibits no distension and no mass. There is no tenderness. There is no rebound and no guarding.  Musculoskeletal: He exhibits no edema.  Lymphadenopathy:    He has no cervical adenopathy.  Neurological: He is alert and oriented to person, place, and time. He displays normal reflexes. No cranial nerve deficit. He exhibits normal muscle tone. Coordination normal.  Skin: Skin is warm and dry. He is not diaphoretic.  Psychiatric: He has a normal mood and affect. His behavior is normal. Judgment and thought content normal.  Vitals reviewed.         Assessment & Plan:  Hyperlipidemia-take both lipid-lowering medications regularly and follow-up in 4 weeks.  He has mixed hyperlipidemia.  GE reflux-continue PPI  Alcoholism in recovery-doing well at present time  Plan: He will return in 4 weeks for repeat lipid panel and follow-up.  He needs serum calcium drawn at that time as well as recent serum calcium was slightly elevated 10.5.

## 2018-02-14 ENCOUNTER — Other Ambulatory Visit: Payer: Self-pay | Admitting: Internal Medicine

## 2018-02-22 ENCOUNTER — Other Ambulatory Visit: Payer: Self-pay | Admitting: Internal Medicine

## 2018-02-22 ENCOUNTER — Other Ambulatory Visit: Payer: BLUE CROSS/BLUE SHIELD | Admitting: Internal Medicine

## 2018-02-22 DIAGNOSIS — E782 Mixed hyperlipidemia: Secondary | ICD-10-CM

## 2018-02-26 ENCOUNTER — Other Ambulatory Visit: Payer: BLUE CROSS/BLUE SHIELD | Admitting: Internal Medicine

## 2018-02-26 DIAGNOSIS — E782 Mixed hyperlipidemia: Secondary | ICD-10-CM

## 2018-02-26 LAB — LIPID PANEL
CHOLESTEROL: 179 mg/dL (ref <200)
HDL: 34 mg/dL — AB (ref >40)
LDL CHOLESTEROL (CALC): 120 mg/dL — AB
Non-HDL Cholesterol (Calc): 145 mg/dL (calc) — ABNORMAL HIGH (ref <130)
TRIGLYCERIDES: 131 mg/dL (ref <150)
Total CHOL/HDL Ratio: 5.3 (calc) — ABNORMAL HIGH (ref <5.0)

## 2018-02-28 ENCOUNTER — Encounter: Payer: Self-pay | Admitting: Internal Medicine

## 2018-02-28 ENCOUNTER — Ambulatory Visit: Payer: BLUE CROSS/BLUE SHIELD | Admitting: Internal Medicine

## 2018-02-28 VITALS — BP 102/70 | HR 122 | Temp 100.3°F | Ht 71.0 in | Wt 204.0 lb

## 2018-02-28 DIAGNOSIS — B349 Viral infection, unspecified: Secondary | ICD-10-CM | POA: Diagnosis not present

## 2018-02-28 DIAGNOSIS — E782 Mixed hyperlipidemia: Secondary | ICD-10-CM | POA: Diagnosis not present

## 2018-02-28 DIAGNOSIS — R509 Fever, unspecified: Secondary | ICD-10-CM | POA: Diagnosis not present

## 2018-02-28 LAB — POCT URINALYSIS DIPSTICK
Appearance: NORMAL
Bilirubin, UA: NEGATIVE
Blood, UA: NEGATIVE
GLUCOSE UA: NEGATIVE
KETONES UA: NEGATIVE
Leukocytes, UA: NEGATIVE
NITRITE UA: NEGATIVE
ODOR: NORMAL
PROTEIN UA: NEGATIVE
Spec Grav, UA: 1.015 (ref 1.010–1.025)
Urobilinogen, UA: 0.2 E.U./dL
pH, UA: 6 (ref 5.0–8.0)

## 2018-02-28 LAB — CBC WITH DIFFERENTIAL/PLATELET
BASOS ABS: 31 {cells}/uL (ref 0–200)
Basophils Relative: 0.4 %
Eosinophils Absolute: 31 cells/uL (ref 15–500)
Eosinophils Relative: 0.4 %
HEMATOCRIT: 43.3 % (ref 38.5–50.0)
Hemoglobin: 14.8 g/dL (ref 13.2–17.1)
LYMPHS ABS: 1732 {cells}/uL (ref 850–3900)
MCH: 29.2 pg (ref 27.0–33.0)
MCHC: 34.2 g/dL (ref 32.0–36.0)
MCV: 85.4 fL (ref 80.0–100.0)
MPV: 10 fL (ref 7.5–12.5)
Monocytes Relative: 5 %
NEUTROS PCT: 72 %
Neutro Abs: 5616 cells/uL (ref 1500–7800)
Platelets: 247 10*3/uL (ref 140–400)
RBC: 5.07 10*6/uL (ref 4.20–5.80)
RDW: 12.9 % (ref 11.0–15.0)
Total Lymphocyte: 22.2 %
WBC mixed population: 390 cells/uL (ref 200–950)
WBC: 7.8 10*3/uL (ref 3.8–10.8)

## 2018-03-04 ENCOUNTER — Encounter: Payer: Self-pay | Admitting: Internal Medicine

## 2018-03-04 ENCOUNTER — Telehealth: Payer: Self-pay | Admitting: Internal Medicine

## 2018-03-04 ENCOUNTER — Ambulatory Visit (INDEPENDENT_AMBULATORY_CARE_PROVIDER_SITE_OTHER): Payer: BLUE CROSS/BLUE SHIELD | Admitting: Internal Medicine

## 2018-03-04 VITALS — BP 110/80 | HR 117 | Temp 100.2°F | Ht 71.0 in | Wt 204.0 lb

## 2018-03-04 DIAGNOSIS — R509 Fever, unspecified: Secondary | ICD-10-CM | POA: Diagnosis not present

## 2018-03-04 DIAGNOSIS — J029 Acute pharyngitis, unspecified: Secondary | ICD-10-CM

## 2018-03-04 LAB — POCT RAPID STREP A (OFFICE): RAPID STREP A SCREEN: NEGATIVE

## 2018-03-04 NOTE — Telephone Encounter (Signed)
He should come get rapid strep test. CBC was normal. Continue Tylenol.

## 2018-03-04 NOTE — Addendum Note (Signed)
Addended by: Gregery NaVALENCIA, Shawn Carattini P on: 03/04/2018 04:08 PM   Modules accepted: Orders

## 2018-03-04 NOTE — Telephone Encounter (Signed)
Scheduled

## 2018-03-04 NOTE — Progress Notes (Signed)
Nurse visit for strep test due to persistent fever.

## 2018-03-04 NOTE — Progress Notes (Signed)
   Subjective:    Patient ID: Meredith StaggersMichael Jason Huizar, male    DOB: May 02, 1971, 47 y.o.   MRN: [id number]  HPI He is here today to follow-up on mixed hyperlipidemia.  He was here in July for physical exam.  At that time total cholesterol was 201, triglycerides 179 and LDL cholesterol 138.  He is made improvement.  Total cholesterol is now 179, HDL is 34, triglycerides 131 and LDL cholesterol 120.  He is been watching his diet.  Recently developed fever and chills.  Only travel history [place] where he played golf.  Has scratchy throat.  CBC checked today is normal.  Dipstick UA is normal.    Review of Systems see above    Objective:   Physical Exam Is in no acute distress.  Pharynx very slightly injected but no exudate is present.  TMs are clear.  Neck supple.  Chest clear to auscultation.       Assessment & Plan:  Fever chills and myalgias?  Viral syndrome Recommend over-the-counter analgesic medication and drink plenty of fluids  Hyperlipidemia-improved with current treatment; continue to watch diet and take lipid-lowering medication  Call if symptoms do not improve  25 minutes spent with patient

## 2018-03-04 NOTE — Telephone Encounter (Signed)
OK to draw lyme and RMSF titers. OK to do monospot test

## 2018-03-04 NOTE — Patient Instructions (Addendum)
Rapid strep test is negative.

## 2018-03-04 NOTE — Telephone Encounter (Addendum)
STREP test was negative, he said that he hasn't noticed any ticks on him but he was thinking about getting tested for that (lyme disease/RMSF or mono? He had a fever of 100.2 in the office.

## 2018-03-04 NOTE — Telephone Encounter (Signed)
Has continued to have fever over the weekend.  It does respond to Tylenol.  He is having some sore throat and a slight HA.  He wants to know if he should just continue to take the Tylenol and wait it out or if you think he should be on some sort of antibiotic?    Ph[facility]:  [facility] [address]  Phone:  #[phone]  Thank you.

## 2018-03-05 ENCOUNTER — Ambulatory Visit (INDEPENDENT_AMBULATORY_CARE_PROVIDER_SITE_OTHER): Payer: BLUE CROSS/BLUE SHIELD | Admitting: Internal Medicine

## 2018-03-05 ENCOUNTER — Encounter: Payer: Self-pay | Admitting: Internal Medicine

## 2018-03-05 VITALS — BP 110/60 | HR 130 | Temp 102.6°F | Ht 71.0 in | Wt 206.0 lb

## 2018-03-05 DIAGNOSIS — R05 Cough: Secondary | ICD-10-CM | POA: Diagnosis not present

## 2018-03-05 DIAGNOSIS — R059 Cough, unspecified: Secondary | ICD-10-CM

## 2018-03-05 DIAGNOSIS — R6883 Chills (without fever): Secondary | ICD-10-CM

## 2018-03-05 DIAGNOSIS — R509 Fever, unspecified: Secondary | ICD-10-CM

## 2018-03-05 LAB — POC INFLUENZA A&B (BINAX/QUICKVUE)
Influenza A, POC: NEGATIVE
Influenza B, POC: NEGATIVE

## 2018-03-05 LAB — MONONUCLEOSIS SCREEN: Heterophile, Mono Screen: NEGATIVE

## 2018-03-05 LAB — ROCKY MTN SPOTTED FVR ABS PNL(IGG+IGM)
RMSF IgG: NOT DETECTED
RMSF IgM: NOT DETECTED

## 2018-03-05 LAB — B. BURGDORFI ANTIBODIES: B burgdorferi Ab IgG+IgM: <0.9 index

## 2018-03-05 MED ORDER — DOXYCYCLINE HYCLATE 100 MG PO TABS: 100.0000 mg | ORAL_TABLET | Freq: Two times a day (BID) | ORAL | 0 refills | Status: DC

## 2018-03-05 NOTE — Progress Notes (Signed)
   Subjective:    Patient ID: Daniel Weaver, male    DOB: 10-Jul-1971, 47 y.o.   MRN: [id number]  HPI Wife called and asked he be seen on urgent basis. Still with fever. RMSF and Lyme titers were negative. Rapid strep screen was negative. CXR ordered for am.Has headache and chills. Has slight scratchy throat and some postnasal drip type symptoms that are vague. No significant cough or sputum production. Recently traveled to [place] in [place] with family but no one else is ill.Monospot test is negative. Rapid flu test is negative.  Today did respiratory virus panel, CBC with diff,C-met which are pending.No urinary symptoms. Dipstick urine was checked last week.    Review of Systems slight headache back of head. No nausea or vomiting.No urinary symptoms.     Objective:   Physical Exam  Constitutional: He is oriented to person, place, and time. He appears well-developed and well-nourished.  HENT:  Head: Normocephalic and atraumatic.  Right Ear: External ear normal.  Left Ear: External ear normal.  There is very slightly injected without exudate.  Eyes: Conjunctivae are normal. Right eye exhibits no discharge. Left eye exhibits no discharge. No scleral icterus.  Neck: Neck supple. No JVD present. No thyromegaly present.  Cardiovascular: Normal rate, regular rhythm and normal heart sounds.  No murmur heard. Pulmonary/Chest: Effort normal and breath sounds normal. No respiratory distress. He has no wheezes. He has no rales.  Abdominal: Soft. He exhibits no mass. There is no guarding.  Tender right lateral rib cage area  Musculoskeletal: He exhibits no edema.  Lymphadenopathy:    He has no cervical adenopathy.  Neurological: He is oriented to person, place, and time.  Kernig's and Brudzinski signs are negative  Skin: Skin is warm and dry.  Flushed  Psychiatric: He has a normal mood and affect. His behavior is normal. Judgment and thought content normal.  Vitals  reviewed.         Assessment & Plan:  I still think this could be a viral syndrome.  We have repeated his CBC.  Respiratory virus panel and CMV titers pending.  I started him on doxycycline 100 mg twice daily for 10 days.  It is late today and he will have chest x-ray tomorrow.  His rapid flu test was negative.  He will alternate Aleve with Tylenol for fever.  Rest and drink plenty of fluids.

## 2018-03-05 NOTE — Patient Instructions (Addendum)
Respiratory virus panel pending as well as CBC and C-met. Start Doxycycline. Have CXR. CMV tiers drawn. Alternate Advil 600 mg tid with Tylenol for fever. Rest and drink fluids.

## 2018-03-06 ENCOUNTER — Ambulatory Visit
Admission: RE | Admit: 2018-03-06 | Discharge: 2018-03-06 | Disposition: A | Discharge status: Home / Self Care | Payer: BLUE CROSS/BLUE SHIELD | Source: Ambulatory Visit | Attending: Internal Medicine | Admitting: Internal Medicine

## 2018-03-06 DIAGNOSIS — R05 Cough: Secondary | ICD-10-CM | POA: Diagnosis not present

## 2018-03-06 LAB — COMPLETE METABOLIC PANEL WITH GFR
AG Ratio: 1.3 (calc) (ref 1.0–2.5)
ALBUMIN MSPROF: 3.9 g/dL (ref 3.6–5.1)
ALT: 24 U/L (ref 9–46)
AST: 27 U/L (ref 10–40)
Alkaline phosphatase (APISO): 65 U/L (ref 40–115)
BILIRUBIN TOTAL: 0.4 mg/dL (ref 0.2–1.2)
BUN: 12 mg/dL (ref 7–25)
CO2: 22 mmol/L (ref 20–32)
CREATININE: 1.09 mg/dL (ref 0.60–1.35)
Calcium: 9 mg/dL (ref 8.6–10.3)
Chloride: 99 mmol/L (ref 98–110)
GFR, EST AFRICAN AMERICAN: 94 mL/min/{1.73_m2} (ref >=60)
GFR, Est Non African American: 81 mL/min/{1.73_m2} (ref >=60)
GLUCOSE: 121 mg/dL — AB (ref 65–99)
Globulin: 3 g/dL (calc) (ref 1.9–3.7)
Potassium: 4.2 mmol/L (ref 3.5–5.3)
Sodium: 132 mmol/L — ABNORMAL LOW (ref 135–146)
TOTAL PROTEIN: 6.9 g/dL (ref 6.1–8.1)

## 2018-03-06 NOTE — Patient Instructions (Signed)
Continue to watch diet exercise and take lipid-lowering medication.  Take over-the-counter analgesics for fever and keep yourself well-hydrated.  Call if symptoms do not improve.

## 2018-03-08 ENCOUNTER — Telehealth: Payer: Self-pay

## 2018-03-08 ENCOUNTER — Encounter: Payer: Self-pay | Admitting: Internal Medicine

## 2018-03-08 ENCOUNTER — Ambulatory Visit: Payer: BLUE CROSS/BLUE SHIELD | Admitting: Internal Medicine

## 2018-03-08 VITALS — BP 120/70 | HR 72 | Temp 100.2°F | Ht 71.0 in | Wt 206.0 lb

## 2018-03-08 DIAGNOSIS — R509 Fever, unspecified: Secondary | ICD-10-CM

## 2018-03-08 DIAGNOSIS — R21 Rash and other nonspecific skin eruption: Secondary | ICD-10-CM

## 2018-03-08 DIAGNOSIS — B019 Varicella without complication: Secondary | ICD-10-CM

## 2018-03-08 DIAGNOSIS — L989 Disorder of the skin and subcutaneous tissue, unspecified: Secondary | ICD-10-CM | POA: Diagnosis not present

## 2018-03-08 DIAGNOSIS — R238 Other skin changes: Secondary | ICD-10-CM

## 2018-03-08 LAB — RESPIRATORY VIRUS PANEL

## 2018-03-08 LAB — CMV ABS, IGG+IGM (CYTOMEGALOVIRUS)

## 2018-03-08 LAB — CBC WITH DIFFERENTIAL/PLATELET
BASOS PCT: 0.3 %
Basophils Absolute: 18 cells/uL (ref 0–200)
EOS ABS: 30 {cells}/uL (ref 15–500)
Eosinophils Relative: 0.5 %
HCT: 40.3 % (ref 38.5–50.0)
HEMOGLOBIN: 13.7 g/dL (ref 13.2–17.1)
Lymphs Abs: 1387 cells/uL (ref 850–3900)
MCH: 28.4 pg (ref 27.0–33.0)
MCHC: 34 g/dL (ref 32.0–36.0)
MCV: 83.6 fL (ref 80.0–100.0)
MPV: 10.4 fL (ref 7.5–12.5)
Monocytes Relative: 7.3 %
Neutro Abs: 4036 cells/uL (ref 1500–7800)
Neutrophils Relative %: 68.4 %
PLATELETS: 225 10*3/uL (ref 140–400)
RBC: 4.82 10*6/uL (ref 4.20–5.80)
RDW: 12.6 % (ref 11.0–15.0)
TOTAL LYMPHOCYTE: 23.5 %
WBC: 5.9 10*3/uL (ref 3.8–10.8)
WBCMIX: 431 {cells}/uL (ref 200–950)

## 2018-03-08 NOTE — Patient Instructions (Signed)
Calamine lotion to rash.  Tylenol or ibuprofen for fever.  Labs drawn and pending.

## 2018-03-08 NOTE — Telephone Encounter (Signed)
Patient called states he is feeling better but this morning he noticed red pots all over his body he said it does not itch. He would like to know if he can come in so you can take a look at it? He said he checked his temperature yesterday at it was 99.9 this morning it was 99.3.

## 2018-03-08 NOTE — Telephone Encounter (Signed)
OK 

## 2018-03-08 NOTE — Progress Notes (Signed)
   Subjective:    Patient ID: Daniel StaggersMichael Jason Weaver, male    DOB: Sep 30, 1970, 47 y.o.   MRN: [id number]  HPI Called today to say he is feeling better but broke out in a rash and was concerned.  He wanted to come by.  He says he has a prior history of chickenpox as a child.  [place] [employer] [place].  [place].    Review of Systems see above     Objective:   Physical Exam He has multiple erythematous papules on trunk with central vesicles.  These are not especially itchy.  His fever has subsided.       Assessment & Plan:  Chickenpox  Plan: To apply calamine lotion to lesions until healed.  Tylenol or ibuprofen.  No aspirin.  Varicella IgG and IgM checked today.

## 2018-03-13 LAB — VARICELLA ZOSTER ANTIBODY, IGM: Varicella Zoster Ab IgM: <=0.9 (ref <=0.90)

## 2018-03-13 LAB — VARICELLA ZOSTER ANTIBODY, IGG: VARICELLA IGG: 2109 {index}

## 2018-03-25 ENCOUNTER — Other Ambulatory Visit: Payer: Self-pay | Admitting: Internal Medicine

## 2018-04-13 DIAGNOSIS — J302 Other seasonal allergic rhinitis: Secondary | ICD-10-CM | POA: Diagnosis not present

## 2018-04-13 DIAGNOSIS — J028 Acute pharyngitis due to other specified organisms: Secondary | ICD-10-CM | POA: Diagnosis not present

## 2018-04-13 DIAGNOSIS — J029 Acute pharyngitis, unspecified: Secondary | ICD-10-CM | POA: Diagnosis not present

## 2018-04-17 ENCOUNTER — Ambulatory Visit (INDEPENDENT_AMBULATORY_CARE_PROVIDER_SITE_OTHER): Payer: BLUE CROSS/BLUE SHIELD | Admitting: Internal Medicine

## 2018-04-17 ENCOUNTER — Encounter: Payer: Self-pay | Admitting: Internal Medicine

## 2018-04-17 VITALS — BP 110/90 | HR 93 | Temp 98.5°F | Ht 71.0 in | Wt 196.0 lb

## 2018-04-17 DIAGNOSIS — R509 Fever, unspecified: Secondary | ICD-10-CM

## 2018-04-17 DIAGNOSIS — R21 Rash and other nonspecific skin eruption: Secondary | ICD-10-CM

## 2018-04-17 NOTE — Patient Instructions (Signed)
We will try to get patient an appointment with infectious disease clinic.  CBC C met and sed rate are drawn today.

## 2018-04-17 NOTE — Progress Notes (Signed)
Subjective:    Patient ID: Daniel Weaver, male    DOB: Aug 14, 1970, 47 y.o.   MRN: [id number] He is here for re-evaluation of fever and rash this time with associated sore throat. HPI Onset last week low grade fever 99 degrees associated with malaise then sore throat. Was seen at [facility] this past Saturday. and tested negative with rapid strep test and Strep A DNA test. Was given Zyrtec. Has developed a rash similar to one he had in late August when we thought he had chicken pox.  Original symptoms started in late August with flu like symptoms and finally after a lot of testing, he broke out in rash. Rash was papular with central vesicle diffusely on trunk and extremities. Has similar rash today but without vesicles.  In late August, history obtained that he had been to [employer] around the time he would have been exposed. What was somewhat confusing was he thought he had history of chicken pox as a child.  In late August he had a normal CBC and C met.  He tested negative for influenza AMB.  He had a Respiratory panel that was negative. Mono screen was negative.  Rapid strep screen was negative.  Urine dipstick was negative.  CMV testing showed IgG less than 0.60 IgM less than 30.  RMSF IgG and IgM were not detected.  B.burgdorfi antibodies were not detected (IgG and IgM).  Western blot was not done.   He was prescribed course of Doxycycline in late August. However, tested negative for Lyme disease and RMSF. i thought he had chicken pox based on rash and fever but IgM was not acutely elevated. Says rash never scabbed over.    Says rash never scabbed over.  Varicella-zoster IgM was less than 0.90 and IgG was 2109.  I thought he had a fairly high IgG to have had chickenpox years ago and it may be that we had missed the window for IgM elevation since we have been working on this diagnosis for a couple of weeks.  Clinically he had symptoms consistent with chickenpox.  At that  time he did not have a sore throat.  He did not go to work for 2 weeks.  He had a negative chest x-ray during this time.  Since then he has been to the beach for a long weekend.  His kids have had fever this week.  As of mid-September until a week ago he felt pretty good.  He says he had chills and low-grade fever for 2 days before sore throat started.  Has not had any fever over 100.5 degrees.  Previously in August his temp was 102.6 degrees on 1 office visit   Review of Systems see above.  Denies nausea vomiting diarrhea.  Has been going to work recently but does not feel well.    Objective:   Physical Exam He has papular discrete lesions on his trunk with no central vesicle.  He says they are not particularly itchy.  His pharynx is red without exudate.  Rapid strep screen here in the office is negative today.  CBC and C met are pending along with sed rate.  His neck is supple.  No significant adenopathy.  TMs are slightly full bilaterally but not red.  Chest is completely clear to auscultation.       Assessment & Plan:  Recurrent rash fever and nail sore throat-etiology unclear  Plan: I suppose he could have another viral syndrome or some recurrent viral  syndrome.  In any case none of this is very clear to me and I would like for him to see an infectious disease consultant.  He is in agreement with this.  I have spent 25 minutes reviewing his chart, taking a detailed history and examining patient.  Course of action outlined with patient.

## 2018-04-18 ENCOUNTER — Encounter: Payer: Self-pay | Admitting: Internal Medicine

## 2018-04-18 ENCOUNTER — Ambulatory Visit (INDEPENDENT_AMBULATORY_CARE_PROVIDER_SITE_OTHER): Payer: BLUE CROSS/BLUE SHIELD | Admitting: Internal Medicine

## 2018-04-18 DIAGNOSIS — R21 Rash and other nonspecific skin eruption: Secondary | ICD-10-CM | POA: Diagnosis not present

## 2018-04-18 LAB — CBC WITH DIFFERENTIAL/PLATELET
BASOS PCT: 0.5 %
Basophils Absolute: 40 cells/uL (ref 0–200)
EOS ABS: 150 {cells}/uL (ref 15–500)
Eosinophils Relative: 1.9 %
HCT: 39.1 % (ref 38.5–50.0)
Hemoglobin: 13.4 g/dL (ref 13.2–17.1)
Lymphs Abs: 2149 cells/uL (ref 850–3900)
MCH: 28.8 pg (ref 27.0–33.0)
MCHC: 34.3 g/dL (ref 32.0–36.0)
MCV: 84.1 fL (ref 80.0–100.0)
MONOS PCT: 7.1 %
MPV: 9.5 fL (ref 7.5–12.5)
Neutro Abs: 5001 cells/uL (ref 1500–7800)
Neutrophils Relative %: 63.3 %
PLATELETS: 314 10*3/uL (ref 140–400)
RBC: 4.65 10*6/uL (ref 4.20–5.80)
RDW: 13.8 % (ref 11.0–15.0)
Total Lymphocyte: 27.2 %
WBC mixed population: 561 cells/uL (ref 200–950)
WBC: 7.9 10*3/uL (ref 3.8–10.8)

## 2018-04-18 LAB — BASIC METABOLIC PANEL
BUN: 13 mg/dL (ref 7–25)
CO2: 24 mmol/L (ref 20–32)
Calcium: 9.3 mg/dL (ref 8.6–10.3)
Chloride: 100 mmol/L (ref 98–110)
Creat: 1.1 mg/dL (ref 0.60–1.35)
GLUCOSE: 76 mg/dL (ref 65–99)
Potassium: 4.3 mmol/L (ref 3.5–5.3)
SODIUM: 135 mmol/L (ref 135–146)

## 2018-04-18 LAB — SEDIMENTATION RATE: Sed Rate: 33 mm/h — ABNORMAL HIGH (ref 0–15)

## 2018-04-18 NOTE — Progress Notes (Signed)
[facility]  Reason for Consult: Low-grade fever and rash Referring Provider: Dr. Eden Emms Baxley  Assessment: It is not entirely clear what is causing his fever rash but I would certainly consider pityriasis rosacea given the appearance, distribution and persistence.  He seems to be getting a little better with time.  I recommended continued observation and follow-up in 2 weeks.  Plan: 1. Follow-up in 2 weeks.  Patient Active Problem List   Diagnosis Date Noted  . Rash 04/18/2018    Priority: High  . Alcohol dependence in remission (HCC) 12/04/2015  . GE reflux 06/13/2011  . HYPERLIPIDEMIA-MIXED 10/22/2008    Patient's Medications  New Prescriptions   No medications on file  Previous Medications   ESCITALOPRAM (LEXAPRO) 20 MG TABLET    Take 1 tablet (20 mg total) by mouth daily.   FENOFIBRATE (TRICOR) 145 MG TABLET    Take 1 tablet (145 mg total) by mouth daily.   PANTOPRAZOLE (PROTONIX) 40 MG TABLET    TAKE 1 TABLET(40 MG) BY MOUTH DAILY   ROSUVASTATIN (CRESTOR) 40 MG TABLET    TAKE 1 TABLET(40 MG) BY MOUTH DAILY   TRAZODONE (DESYREL) 100 MG TABLET    Take 1 tablet (100 mg total) by mouth at bedtime.  Modified Medications   No medications on file  Discontinued Medications   No medications on file    HPI: Daniel Weaver is a 47 y.o. male who began to develop low-grade fevers in late August.  He also noted some scratchy throat.  He was seen in the office where rapid strep test was negative.  Lyme and RMSF serologies were negative.  Influenza PCR was negative his Monospot test was negative.  He was seen again on 03/05/2018 and had a temperature of 2.6.  Chest x-ray showed no acute disease.  Respiratory virus panel was negative.  CMV serology was negative he was started on empiric doxycycline.  He was seen again 3 days later with a temperature 100.2.  At that time he had developed a diffuse red maculopapular rash from his neck down.  It  was mildly pruritic.  Varicella IgG was positive but his IgM was negative.  The rash seemed to fade then recently came back.  He is continued to have some scratchy throat and intermittent very low-grade fevers.  He feels like the fever is slowly going away.  Review of Systems: Review of Systems  Constitutional: Positive for fever and malaise/fatigue. Negative for chills, diaphoresis and weight loss.  HENT: Positive for sore throat. Negative for congestion.   Respiratory: Negative for cough, sputum production and shortness of breath.   Cardiovascular: Negative for chest pain.  Gastrointestinal: Negative for abdominal pain, diarrhea, nausea and vomiting.  Genitourinary: Negative for dysuria.  Musculoskeletal: Negative for back pain, joint pain and myalgias.  Skin: Positive for itching and rash.  Neurological: Negative for dizziness and headaches.      Past Medical History:  Diagnosis Date  . GERD (gastroesophageal reflux disease)   . HTN (hypertension)   . Hyperlipidemia     Social History   Tobacco Use  . Smoking status: Current Every Day Smoker    Packs/day: 0.50    Years: 15.00    Pack years: 7.50    Types: Cigarettes    Last attempt to quit: 05/17/2004    Years since quitting: 13.9  . Smokeless tobacco: Never Used  . Tobacco comment: nicotine gum prn  Substance Use  Topics  . Alcohol use: Yes    Alcohol/week: 0.0 standard drinks    Comment: sociallly  . Drug use: No    Family History  Problem Relation Age of Onset  . Heart attack Maternal Grandfather 64  . Heart attack Father 28   No Known Allergies  OBJECTIVE: Vitals:   04/18/18 1006  BP: 120/83  Pulse: 96  Temp: 98.2 F (36.8 C)  TempSrc: Oral  Weight: 195 lb (88.5 kg)  Height: 5\' 11"  (1.803 m)   Body mass index is 27.2 kg/m.   Physical Exam  Constitutional: He is oriented to person, place, and time.  He appears healthy and in no distress.  HENT:  Mouth/Throat: No oropharyngeal exudate.  Eyes:  Conjunctivae are normal.  Cardiovascular: Normal rate, regular rhythm and normal heart sounds.  No murmur heard. Pulmonary/Chest: Effort normal and breath sounds normal.  Abdominal: Soft. He exhibits no distension. There is no tenderness.  Musculoskeletal: Normal range of motion. He exhibits no edema or tenderness.  Lymphadenopathy:       Head (right side): No submandibular adenopathy present.       Head (left side): No submandibular adenopathy present.    He has no cervical adenopathy.    He has no axillary adenopathy.  Neurological: He is alert and oriented to person, place, and time.  Skin: Rash noted.  He has a diffuse red macular rash from his neck down to his ankles.  The lesions are slightly raised and with some silver scale.  There is sparing of his palms soles and face.  Psychiatric: He has a normal mood and affect.    Microbiology: No results found for this or any previous visit (from the past 240 hour(s)).  Cliffton Asters, MD [facility] [phone] pager   [phone] cell 04/18/2018, 10:34 AM

## 2018-05-06 ENCOUNTER — Ambulatory Visit: Payer: BLUE CROSS/BLUE SHIELD | Admitting: Internal Medicine

## 2018-06-10 DIAGNOSIS — D2261 Melanocytic nevi of right upper limb, including shoulder: Secondary | ICD-10-CM | POA: Diagnosis not present

## 2018-06-10 DIAGNOSIS — D2262 Melanocytic nevi of left upper limb, including shoulder: Secondary | ICD-10-CM | POA: Diagnosis not present

## 2018-06-10 DIAGNOSIS — D2272 Melanocytic nevi of left lower limb, including hip: Secondary | ICD-10-CM | POA: Diagnosis not present

## 2018-06-10 DIAGNOSIS — D225 Melanocytic nevi of trunk: Secondary | ICD-10-CM | POA: Diagnosis not present

## 2018-07-15 DIAGNOSIS — L986 Other infiltrative disorders of the skin and subcutaneous tissue: Secondary | ICD-10-CM | POA: Diagnosis not present

## 2018-07-15 DIAGNOSIS — R21 Rash and other nonspecific skin eruption: Secondary | ICD-10-CM | POA: Diagnosis not present

## 2018-07-19 ENCOUNTER — Ambulatory Visit (INDEPENDENT_AMBULATORY_CARE_PROVIDER_SITE_OTHER): Payer: BLUE CROSS/BLUE SHIELD | Admitting: Infectious Disease

## 2018-07-19 ENCOUNTER — Encounter: Payer: Self-pay | Admitting: Infectious Disease

## 2018-07-19 VITALS — BP 147/85 | HR 93 | Temp 98.6°F | Wt 198.0 lb

## 2018-07-19 DIAGNOSIS — R509 Fever, unspecified: Secondary | ICD-10-CM | POA: Diagnosis not present

## 2018-07-19 DIAGNOSIS — R21 Rash and other nonspecific skin eruption: Secondary | ICD-10-CM

## 2018-07-19 DIAGNOSIS — L519 Erythema multiforme, unspecified: Secondary | ICD-10-CM

## 2018-07-19 DIAGNOSIS — F1021 Alcohol dependence, in remission: Secondary | ICD-10-CM | POA: Diagnosis not present

## 2018-07-19 HISTORY — DX: Erythema multiforme, unspecified: L51.9

## 2018-07-19 HISTORY — DX: Fever, unspecified: R50.9

## 2018-07-19 MED ORDER — VALACYCLOVIR HCL 1 G PO TABS: 1000.0000 mg | ORAL_TABLET | Freq: Two times a day (BID) | ORAL | 1 refills | Status: AC

## 2018-07-19 NOTE — Progress Notes (Signed)
Subjective:  Has had recurrence of his rash along with night sweats  Patient ID: Daniel Weaver, male    DOB: 06-29-1971, 48 y.o.   MRN: [id number]  HPI  Daniel Weaver is a 48 year old man who began having recurrent rashes this October they were accompanied by low-grade temperatures of 99 degrees to 100 reason one-point up to 101.2.  He also would have night sweats as well.  Initially had symptoms of a scratchy throat and seen in primary care physician office where rapid strep was negative. Lyme and RMSF serologies were negative.  Influenza PCR was negative his Monospot test was negative.  He was seen again on 03/05/2018 and had a temperature of 2.6.  Chest x-ray showed no acute disease.  Respiratory virus panel was negative.  CMV serology was negative he was started on empiric doxycycline.  He was seen again 3 days later with a temperature 100.2.  At that time he had developed a diffuse red maculopapular rash from his neck down.  It was mildly pruritic.  Varicella IgG was positive but his IgM was negative.  The rash seemed to fade then recently came back.  He is continued to have some scratchy throat and intermittent very low-grade fevers  He was referred to [place] in infectious disease my partner Dr. Megan Salon saw him.  Dr. Megan Salon thought that this rash might be pityriasis rosacea   Was for observation and follow-up with Dr. Megan Salon in 2 weeks.  Since then the patient did well but then has developed further recurrences of the rash which currently is over his arms and pretty extensively found to be on his back.  He has been seen by dermatology with [facility] dermatology and Wilhemina Bonito who performed a biopsy of 1 of the lesions.  According the patient Dr. Ronnald Ramp thinks that these lesions may be consistent with erythema multiforme.  Also suggested the initiation of an antiviral.  I have reviewed with Daniel Weaver that herpes simplex 2 can be a cause of recurrent erythema multiforme.  I am not  familiar with herpes simplex 1 causing it.  He has not had overt genital herpes throughout his lifetime but certainly does not mean he could not be a carrier of HSV-2.  Many individuals contracted [place] with even their first sexual contact.  Certainly HSV-1 is widespread and 90% plus the population have this is is acquired easily through kissing another intimate contact even with family members.  I feel for the first for thoroughness he should also have an HIV test part of the work-up for patient with fever and rash.  Additionally I thought the syphilis test was appropriate and I thought additionally if he want to be extra thorough we will could do test for hepatitis B and hepatitis C.  In terms of risk factors for all of the above he has had 20 lifetime sexual partners but been monogamous with his wife.  He has no history of intravenous drug use.  He did have problems with alcohol abuse in the past but does not drink any alcohol for 16 months.  He has no history of blood transfusions in the past  No tattoos     Past Medical History:  Diagnosis Date  . Erythema multiforme 07/19/2018  . FUO (fever of unknown origin) 07/19/2018  . GERD (gastroesophageal reflux disease)   . HTN (hypertension)   . Hyperlipidemia     Past Surgical History:  Procedure Laterality Date  . LASIK  2009    Family  History  Problem Relation Age of Onset  . Heart attack Maternal Grandfather 64  . Heart attack Father 73      Social History   Socioeconomic History  . Marital status: Married    Spouse name: Not on file  . Number of children: 2  . Years of education: Not on file  . Highest education level: Not on file  Occupational History  . Occupation: makes Passenger transport manager: [employer]  Social Needs  . Financial resource strain: Not on file  . Food insecurity:    Worry: Not on file    Inability: Not on file  . Transportation needs:    Medical: Not on file     Non-medical: Not on file  Tobacco Use  . Smoking status: Current Every Day Smoker    Packs/day: 0.50    Years: 15.00    Pack years: 7.50    Types: Cigarettes    Last attempt to quit: 05/17/2004    Years since quitting: 14.1  . Smokeless tobacco: Never Used  . Tobacco comment: nicotine gum prn  Substance and Sexual Activity  . Alcohol use: Yes    Alcohol/week: 0.0 standard drinks    Comment: sociallly  . Drug use: No  . Sexual activity: Not on file  Lifestyle  . Physical activity:    Days per week: Not on file    Minutes per session: Not on file  . Stress: Not on file  Relationships  . Social connections:    Talks on phone: Not on file    Gets together: Not on file    Attends religious service: Not on file    Active member of club or organization: Not on file    Attends meetings of clubs or organizations: Not on file    Relationship status: Not on file  Other Topics Concern  . Not on file  Social History Narrative  . Not on file    No Known Allergies   Current Outpatient Medications:  .  fenofibrate (TRICOR) 145 MG tablet, Take 1 tablet (145 mg total) by mouth daily., Disp: 90 tablet, Rfl: 1 .  pantoprazole (PROTONIX) 40 MG tablet, TAKE 1 TABLET(40 MG) BY MOUTH DAILY, Disp: 90 tablet, Rfl: 3 .  rosuvastatin (CRESTOR) 40 MG tablet, TAKE 1 TABLET(40 MG) BY MOUTH DAILY, Disp: 90 tablet, Rfl: 1 .  traZODone (DESYREL) 100 MG tablet, Take 1 tablet (100 mg total) by mouth at bedtime., Disp: 90 tablet, Rfl: 1 .  escitalopram (LEXAPRO) 20 MG tablet, Take 1 tablet (20 mg total) by mouth daily. (Patient not taking: Reported on 07/19/2018), Disp: 90 tablet, Rfl: 0 .  valACYclovir (VALTREX) 1000 MG tablet, Take 1 tablet (1,000 mg total) by mouth 2 (two) times daily for 10 days., Disp: 20 tablet, Rfl: 1   Review of Systems  Constitutional: Positive for chills, diaphoresis and fever. Negative for unexpected weight change.  HENT: Negative for congestion and sore throat.   Eyes:  Negative for photophobia.  Respiratory: Negative for cough, shortness of breath and wheezing.   Cardiovascular: Negative for chest pain, palpitations and leg swelling.  Gastrointestinal: Negative for abdominal pain, blood in stool, constipation, diarrhea, nausea and vomiting.  Genitourinary: Negative for dysuria, flank pain and hematuria.  Musculoskeletal: Negative for back pain and myalgias.  Skin: Positive for rash.  Neurological: Negative for dizziness, weakness and headaches.  Hematological: Does not bruise/bleed easily.  Psychiatric/Behavioral: Negative for suicidal ideas.  Objective:   Physical Exam Constitutional:      Appearance: He is well-developed.  HENT:     Head: Normocephalic and atraumatic.  Eyes:     Conjunctiva/sclera: Conjunctivae normal.  Neck:     Musculoskeletal: Normal range of motion and neck supple.  Cardiovascular:     Rate and Rhythm: Normal rate and regular rhythm.  Pulmonary:     Effort: Pulmonary effort is normal. No respiratory distress.     Breath sounds: No wheezing.  Abdominal:     General: Abdomen is flat. There is no distension.     Palpations: Abdomen is soft.  Musculoskeletal: Normal range of motion.        General: No tenderness.  Skin:    General: Skin is warm and dry.     Coloration: Skin is not pale.     Findings: Rash present. No erythema.  Neurological:     General: No focal deficit present.     Mental Status: He is alert and oriented to person, place, and time.  Psychiatric:        Mood and Affect: Mood normal.        Behavior: Behavior normal.        Thought Content: Thought content normal.        Judgment: Judgment normal.    Rash present today July 19, 2018:             Assessment & Plan:   Recurrent rash with subjective low-grade temperatures:  Early erythema multiforme that is recurring could be the diagnosis.  The pathology report from Dr. Ronnald Ramp office is pending.  I offered to check serologies  for herpes simplex 1 and 2 by herpes simplex glycoprotein's.  Did caution the patient that frequently patients and their spouses can become emotional and not here me when I counsel them with regards to the meaning of these test should they be positive  Then many people in [place] are carriers of herpes simplex 1 probably more than 90% and herpes 2 on the order of at least 25% and it very certainly by Race ethnicity and demographics and vast MAJORITY of such visuals never have overt clinical disease such as "cold sores or genital herpes.  Infections can be contracted early on in life and carried for decades without any overt manifestations and the discovery of antibodies to them does not in any way mean that a person has acquired these through new sexual activity.  It also does not disprove that they have acquired it through new sexual activity.  Daniel Weaver seems to understand this quite well and not be face personally but he did have some concern that his wife could react more emotionally to this potentially being a herpes simplex type II diagnosis.  Therefore for now we will hold off on checking HSV-1 and HSV-2 serologies.  The biopsy may show herpes in the path though I would not distinguish between the 2 different viruses PCR would be able to do that.  He does also want to try an empiric trial of antivirals and we will give him Valtrex 1 g twice daily for 10 days.  I will plan on seeing him at the end of the month.  Far as his overall fevers they are not over 101.5 and he does not have much in the way of other systemic symptoms I do not think we should waste money on a expensive and comprehensive work-up for FUO when the rash seems to be the clue  to the diagnosis and he only has a subjective fevers when he has the rash.  I spent greater than 25 minutes with the patient including greater than 50% of time in face to face counsel of the patient the work-up for fever and rash and erythema  multiform a if that is indeed the cause association with herpes simplex 2 different infectious diseases that can be tested for in a patient with fever and rash and in coordination of his care.

## 2018-07-20 LAB — HIV ANTIBODY (ROUTINE TESTING W REFLEX): HIV 1&2 Ab, 4th Generation: NONREACTIVE

## 2018-07-23 ENCOUNTER — Other Ambulatory Visit: Payer: BLUE CROSS/BLUE SHIELD | Admitting: Internal Medicine

## 2018-07-25 ENCOUNTER — Ambulatory Visit: Payer: BLUE CROSS/BLUE SHIELD | Admitting: Internal Medicine

## 2018-07-26 DIAGNOSIS — R21 Rash and other nonspecific skin eruption: Secondary | ICD-10-CM | POA: Diagnosis not present

## 2018-07-30 ENCOUNTER — Ambulatory Visit: Payer: BLUE CROSS/BLUE SHIELD | Admitting: Infectious Disease

## 2018-08-05 ENCOUNTER — Ambulatory Visit (INDEPENDENT_AMBULATORY_CARE_PROVIDER_SITE_OTHER): Payer: BLUE CROSS/BLUE SHIELD | Admitting: Internal Medicine

## 2018-08-05 ENCOUNTER — Telehealth: Payer: Self-pay | Admitting: Internal Medicine

## 2018-08-05 ENCOUNTER — Encounter: Payer: Self-pay | Admitting: Internal Medicine

## 2018-08-05 VITALS — BP 120/80 | HR 80 | Temp 98.6°F | Ht 71.0 in | Wt 197.0 lb

## 2018-08-05 DIAGNOSIS — J111 Influenza due to unidentified influenza virus with other respiratory manifestations: Secondary | ICD-10-CM

## 2018-08-05 DIAGNOSIS — R6883 Chills (without fever): Secondary | ICD-10-CM

## 2018-08-05 DIAGNOSIS — R21 Rash and other nonspecific skin eruption: Secondary | ICD-10-CM | POA: Diagnosis not present

## 2018-08-05 DIAGNOSIS — R509 Fever, unspecified: Secondary | ICD-10-CM | POA: Diagnosis not present

## 2018-08-05 LAB — POCT RAPID STREP A (OFFICE): RAPID STREP A SCREEN: NEGATIVE

## 2018-08-05 LAB — POCT INFLUENZA A/B
Influenza A, POC: NEGATIVE
Influenza B, POC: NEGATIVE

## 2018-08-05 MED ORDER — OSELTAMIVIR PHOSPHATE 75 MG PO CAPS: 75.0000 mg | ORAL_CAPSULE | Freq: Two times a day (BID) | ORAL | 0 refills | Status: DC

## 2018-08-05 NOTE — Telephone Encounter (Signed)
Needs OV come in now

## 2018-08-05 NOTE — Patient Instructions (Signed)
Rest and drink plenty of fluids.  Tylenol as needed for fever.  Acknowledged that Dermatologist has asked him to discontinue Crestor to see if rash improves.  Tamiflu 75 mg twice daily for 5 days.

## 2018-08-05 NOTE — Progress Notes (Signed)
   Subjective:    Patient ID: Daniel Weaver, male    DOB: 1971/04/09, 48 y.o.   MRN: [id number]  HPI 48 year old Male who did not take flu vaccine this season because he was having issues with a rash.  Apparently his son tested positive for flu today.  Patient did not feel well yesterday.  He has developed a sore throat and some upper respiratory congestion.  Felt feverish.  During the night he had some shaking chills.  He and his wife both called separately asking for Tamiflu for him.  Advised office visit and worked him in this afternoon.  He tells me that Dermatologist asked him to discontinue Crestor to see if his rash improves.  He has been off it for short period of time.  He is being seen by infectious disease physicians and dermatologist regarding rash.  See previous notes.  Review of Systems no nausea or vomiting.  No productive cough.  Malaise and fatigue.  No complaint of ear pain.     Objective:   Physical Exam Looks fatigued.  Pharynx is red.  Rapid strep screen negative.  Rapid flu test is negative.  TMs are full bilaterally.  They are not red.  Neck is supple.  No adenopathy.  Chest clear to auscultation without rales or wheezing.       Assessment & Plan:  Probable influenza with exposure to sun  Plan: Tamiflu 75 mg twice daily for 5 days.  Tylenol as needed for fever/sore throat pain/myalgias.  Rest and drink plenty of fluids.  Call if not improving in 24 to 48 hours or sooner if worse.

## 2018-08-05 NOTE — Telephone Encounter (Signed)
Appointment given for 4:15 or as soon as patient can arrive.  Work-in.

## 2018-08-05 NOTE — Telephone Encounter (Signed)
Patient states that he is having fever, sore throat, symptoms of the flu basically.  His son was just diagnosed a few moments ago at the Pediatrician.  He is wanting to know if you would be willing to send him Tamiflu in without seeing him?    Also, he wanted to make you aware that he has been seen by Inf Dis physicians and a dermatologist.  States for the past couple of months trying to get those spots that were all over his body cleared up.  States that he was advised NOT to take any type of vaccinations at the present time because they were trying to decide if he was having some sort of allergic reaction, etc.  So, he did NOT have a flu shot.    Does he still need to come in?

## 2018-08-06 ENCOUNTER — Telehealth: Payer: Self-pay | Admitting: Internal Medicine

## 2018-08-06 LAB — RESPIRATORY VIRUS PANEL
Adenovirus B: NOT DETECTED
HUMAN PARAINFLU VIRUS 1: NOT DETECTED
HUMAN PARAINFLU VIRUS 2: NOT DETECTED
HUMAN PARAINFLU VIRUS 3: NOT DETECTED
INFLUENZA A SUBTYPE H1: NOT DETECTED
INFLUENZA A SUBTYPE H3: NOT DETECTED
Influenza A: NOT DETECTED
Influenza B: NOT DETECTED
Metapneumovirus: NOT DETECTED
RHINOVIRUS: NOT DETECTED
Respiratory Syncytial Virus A: NOT DETECTED
Respiratory Syncytial Virus B: NOT DETECTED

## 2018-08-06 LAB — CBC WITH DIFFERENTIAL/PLATELET
Absolute Monocytes: 600 cells/uL (ref 200–950)
Basophils Absolute: 30 cells/uL (ref 0–200)
Basophils Relative: 0.4 %
Eosinophils Absolute: 98 cells/uL (ref 15–500)
Eosinophils Relative: 1.3 %
HCT: 39.8 % (ref 38.5–50.0)
Hemoglobin: 13.7 g/dL (ref 13.2–17.1)
Lymphs Abs: 2160 cells/uL (ref 850–3900)
MCH: 29.6 pg (ref 27.0–33.0)
MCHC: 34.4 g/dL (ref 32.0–36.0)
MCV: 86 fL (ref 80.0–100.0)
MPV: 10.1 fL (ref 7.5–12.5)
Monocytes Relative: 8 %
Neutro Abs: 4613 cells/uL (ref 1500–7800)
Neutrophils Relative %: 61.5 %
Platelets: 253 10*3/uL (ref 140–400)
RBC: 4.63 10*6/uL (ref 4.20–5.80)
RDW: 13.4 % (ref 11.0–15.0)
Total Lymphocyte: 28.8 %
WBC: 7.5 10*3/uL (ref 3.8–10.8)

## 2018-08-06 NOTE — Telephone Encounter (Signed)
Spoke with patient who states that he is feeling "slightly" better today than yesterday.  He is still taking some Advil for a slight fever.  Asked patient to call us back if he doesn't feel he's much improved by Thursday.  Patient verbalized understanding of our conversation.

## 2018-09-17 DIAGNOSIS — R21 Rash and other nonspecific skin eruption: Secondary | ICD-10-CM | POA: Diagnosis not present

## 2018-09-17 DIAGNOSIS — M2550 Pain in unspecified joint: Secondary | ICD-10-CM | POA: Diagnosis not present

## 2018-09-22 ENCOUNTER — Other Ambulatory Visit: Payer: Self-pay | Admitting: Internal Medicine

## 2018-09-23 ENCOUNTER — Encounter: Payer: Self-pay | Admitting: Internal Medicine

## 2019-01-07 ENCOUNTER — Encounter: Payer: Self-pay | Admitting: Internal Medicine

## 2019-01-07 ENCOUNTER — Telehealth: Payer: Self-pay | Admitting: Internal Medicine

## 2019-01-07 ENCOUNTER — Ambulatory Visit (INDEPENDENT_AMBULATORY_CARE_PROVIDER_SITE_OTHER): Payer: BC Managed Care – PPO | Admitting: Internal Medicine

## 2019-01-07 VITALS — Ht 71.0 in

## 2019-01-07 DIAGNOSIS — F411 Generalized anxiety disorder: Secondary | ICD-10-CM | POA: Diagnosis not present

## 2019-01-07 MED ORDER — CLONAZEPAM 0.5 MG PO TABS: ORAL_TABLET | ORAL | 0 refills | Status: DC

## 2019-01-07 NOTE — Telephone Encounter (Signed)
How about 4:30 pm today?

## 2019-01-07 NOTE — Patient Instructions (Signed)
Klonopin 0.5 mg up to twice daily as needed for anxiety state.

## 2019-01-07 NOTE — Telephone Encounter (Signed)
Scheduled virtual visit °

## 2019-01-07 NOTE — Telephone Encounter (Signed)
Daniel Weaver [phone]  Daniel Weaver would like to talk to you about getting something for anxiety. He is experiencing some with this pandemic and the pressure of trying to go back to work. They have had to confirmed cases at work and one suspect. Worrying about bringing COVID home to his family. He would like virtual visit.

## 2019-01-07 NOTE — Progress Notes (Signed)
   Subjective:    Patient ID: Daniel Weaver, male    DOB: 09-30-1970, 48 y.o.   MRN: [id number]  HPI 48 year old Male seen today by interactive audio and video telecommunications due to the coronavirus pandemic.  He is agreeable to visit in this format.  He is identified using 2 identifiers as Daniel Weaver, a patient in this practice.  Corene Cornea has been feeling anxious recently.  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  For a while around the beginning of the pandemic in the spring he went to [place] home for several weeks but is back home now.  The company is ramping back up but but there have been some issues with a few people testing positive for COVID-19 on the operations floor.  They have instituted some restrictions including wearing masks.  He is worried about the pandemic and he is worried for his family.  He has had some issues sleeping.  He is a recovering alcoholic.  Admits to feeling anxious.  Review of Systems     Objective:   Physical Exam  His affect is normal and his thought processes are normal at present time.  He understands it is normal to feel anxious during such an unprecedented time.  Says his wife will handle the distribution of his medication with his history of alcoholism.      Assessment & Plan:  Anxiety state-I do not think counseling is warranted at present time.  Alcoholism in recovery  Plan: He will take Klonopin 0.5 mg as needed twice daily for anxiety.  #60 no refill.  Follow-up as needed.

## 2019-04-07 ENCOUNTER — Other Ambulatory Visit: Payer: Self-pay | Admitting: Internal Medicine

## 2019-04-07 NOTE — Telephone Encounter (Signed)
Scheduled tomorrow at 12:00pm.

## 2019-04-07 NOTE — Telephone Encounter (Signed)
He needs a visit maybe tomorrow. We can schedule CPE later. No refills until seen.

## 2019-04-07 NOTE — Telephone Encounter (Signed)
Do not refill until seen 

## 2019-04-07 NOTE — Telephone Encounter (Signed)
Pt called and said that he was trying to get a medication refilled but that we denied refills, Me or Araceli didn't see any notes about it but pt is due for CPE which I scheduled him for. Pt wants to get a refill on clonazePAM (KLONOPIN) 0.5 MG tablet. [facility] on [address]

## 2019-04-07 NOTE — Addendum Note (Signed)
Addended by: Mady Haagensen on: 04/07/2019 03:26 PM   Modules accepted: Orders

## 2019-04-08 ENCOUNTER — Other Ambulatory Visit: Payer: Self-pay

## 2019-04-08 ENCOUNTER — Ambulatory Visit: Payer: BC Managed Care – PPO | Admitting: Internal Medicine

## 2019-04-08 ENCOUNTER — Encounter: Payer: Self-pay | Admitting: Internal Medicine

## 2019-04-08 VITALS — BP 120/80 | HR 97 | Temp 98.7°F | Ht 71.0 in | Wt 187.0 lb

## 2019-04-08 DIAGNOSIS — F1011 Alcohol abuse, in remission: Secondary | ICD-10-CM

## 2019-04-08 DIAGNOSIS — F1021 Alcohol dependence, in remission: Secondary | ICD-10-CM

## 2019-04-08 DIAGNOSIS — E782 Mixed hyperlipidemia: Secondary | ICD-10-CM

## 2019-04-08 DIAGNOSIS — K219 Gastro-esophageal reflux disease without esophagitis: Secondary | ICD-10-CM

## 2019-04-08 DIAGNOSIS — F411 Generalized anxiety disorder: Secondary | ICD-10-CM | POA: Diagnosis not present

## 2019-04-08 MED ORDER — CLONAZEPAM 0.5 MG PO TABS: ORAL_TABLET | ORAL | 5 refills | Status: DC

## 2019-04-08 NOTE — Patient Instructions (Signed)
Flu vaccine is up-to-date.  Klonopin has been refilled as requested.  Encourage patient to return to Alcoholics Anonymous.  Continue current medications.  CPE and fasting labs scheduled for January 2021.

## 2019-04-08 NOTE — Progress Notes (Signed)
   Subjective:    Patient ID: Daniel Weaver, male    DOB: 09-26-1970, 48 y.o.   MRN: [id number]  HPI 48 year old Male to follow up on anxiety and other issues.  He previously had issues with a rash in the Fall 2019 that was initially thought to be chickenpox which developed after a trip with his family to [place] in [place].  He tested negative for Lyme disease and RMSF.  He was subsequently seen in infectious disease clinic and was thought to have erythema multiforme.  He had a biopsy of 1 of the lesions and Dr. Ronnald Ramp agreed for dermatology thought it might be erythema multiforme.  During the pandemic this is gone away and he has had no recurrence.  In March he had a 3-day slip with recurrent alcohol abuse.  He currently is not going to [place].  Says he has been doing okay with with abstinence from alcohol recently.  Worked remotely x 2.5 months. It was stressful.  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  There has been some Covid exposure with workers.  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  Still feels that he needs something for anxiety.  He has run out of Klonopin.  He also takes Crestor for hyperlipidemia, Protonix for GE reflux and Crestor and TriCor for hyperlipidemia.  He is also on trazodone 100 mg at bedtime.       Review of Systems see above     Objective:   Physical Exam Blood pressure is 120/80, weight 187 pounds, BMI 26.08 pulse 97.  I spent 25 minutes speaking with him about these issues today.  He is past due for physical examination.  His last exam was July 2019.  He has been booked for January 2021.       Assessment & Plan:  Anxiety state  History of alcohol abuse  Mixed hyperlipidemia  Situational stress  GE reflux treated with PPI  He is already had flu vaccine at pharmacy.  I have refilled Klonopin as requested.  Plan: He will return in January  for health maintenance exam and evaluation of medical issues along with fasting labs.  Encouraged him to return to [place].  Time spent with patient 25 minutes including reviewing records regarding rash/erythema multiforme, medications and medical issues such as mixed hyperlipidemia GE reflux and anxiety.

## 2019-06-26 ENCOUNTER — Other Ambulatory Visit: Payer: Self-pay

## 2019-06-26 ENCOUNTER — Emergency Department (HOSPITAL_COMMUNITY): Admission: EM | Admit: 2019-06-26 | Payer: BLUE CROSS/BLUE SHIELD | Source: Home / Self Care

## 2019-06-28 DIAGNOSIS — Z20828 Contact with and (suspected) exposure to other viral communicable diseases: Secondary | ICD-10-CM | POA: Diagnosis not present

## 2019-06-30 ENCOUNTER — Telehealth: Payer: Self-pay | Admitting: Internal Medicine

## 2019-06-30 NOTE — Telephone Encounter (Signed)
Called and spoke with Daniel Weaver about them going and being tested for COVID at the [facility], she stated that they had gone as an abundance of caution because daughter had been exposed thru eating lunch at school, where they tested positive later. [name] has also been exposed at work and he is fallowing all of his work guidelines for exposure. They also wanted to be sure they did not have it before seeing family over the Holiday. Results are not in yet.

## 2019-07-06 DIAGNOSIS — Z20828 Contact with and (suspected) exposure to other viral communicable diseases: Secondary | ICD-10-CM | POA: Diagnosis not present

## 2019-07-09 DIAGNOSIS — Z20828 Contact with and (suspected) exposure to other viral communicable diseases: Secondary | ICD-10-CM | POA: Diagnosis not present

## 2019-07-15 ENCOUNTER — Other Ambulatory Visit: Payer: Self-pay

## 2019-07-15 ENCOUNTER — Other Ambulatory Visit: Payer: BC Managed Care – PPO | Admitting: Internal Medicine

## 2019-07-15 DIAGNOSIS — F411 Generalized anxiety disorder: Secondary | ICD-10-CM | POA: Diagnosis not present

## 2019-07-15 DIAGNOSIS — E782 Mixed hyperlipidemia: Secondary | ICD-10-CM | POA: Diagnosis not present

## 2019-07-15 DIAGNOSIS — R7309 Other abnormal glucose: Secondary | ICD-10-CM | POA: Diagnosis not present

## 2019-07-15 DIAGNOSIS — K219 Gastro-esophageal reflux disease without esophagitis: Secondary | ICD-10-CM | POA: Diagnosis not present

## 2019-07-15 DIAGNOSIS — Z0000 Encounter for general adult medical examination without abnormal findings: Secondary | ICD-10-CM | POA: Diagnosis not present

## 2019-07-15 DIAGNOSIS — F1021 Alcohol dependence, in remission: Secondary | ICD-10-CM | POA: Diagnosis not present

## 2019-07-17 ENCOUNTER — Encounter: Payer: BC Managed Care – PPO | Admitting: Internal Medicine

## 2019-07-17 LAB — COMPLETE METABOLIC PANEL WITH GFR
AG Ratio: 1.7 (calc) (ref 1.0–2.5)
ALT: 21 U/L (ref 9–46)
AST: 17 U/L (ref 10–40)
Albumin: 4.6 g/dL (ref 3.6–5.1)
Alkaline phosphatase (APISO): 59 U/L (ref 36–130)
BUN: 12 mg/dL (ref 7–25)
CO2: 27 mmol/L (ref 20–32)
Calcium: 9.8 mg/dL (ref 8.6–10.3)
Chloride: 101 mmol/L (ref 98–110)
Creat: 1.09 mg/dL (ref 0.60–1.35)
GFR, Est African American: 93 mL/min/{1.73_m2} (ref >=60)
GFR, Est Non African American: 80 mL/min/{1.73_m2} (ref >=60)
Globulin: 2.7 g/dL (calc) (ref 1.9–3.7)
Glucose, Bld: 109 mg/dL — ABNORMAL HIGH (ref 65–99)
Potassium: 4.7 mmol/L (ref 3.5–5.3)
Sodium: 135 mmol/L (ref 135–146)
Total Bilirubin: 0.7 mg/dL (ref 0.2–1.2)
Total Protein: 7.3 g/dL (ref 6.1–8.1)

## 2019-07-17 LAB — TEST AUTHORIZATION

## 2019-07-17 LAB — CBC WITH DIFFERENTIAL/PLATELET
Absolute Monocytes: 347 cells/uL (ref 200–950)
Basophils Absolute: 37 cells/uL (ref 0–200)
Basophils Relative: 0.6 %
Eosinophils Absolute: 130 cells/uL (ref 15–500)
Eosinophils Relative: 2.1 %
HCT: 43.7 % (ref 38.5–50.0)
Hemoglobin: 15.1 g/dL (ref 13.2–17.1)
Lymphs Abs: 1631 cells/uL (ref 850–3900)
MCH: 30.6 pg (ref 27.0–33.0)
MCHC: 34.6 g/dL (ref 32.0–36.0)
MCV: 88.5 fL (ref 80.0–100.0)
MPV: 10.4 fL (ref 7.5–12.5)
Monocytes Relative: 5.6 %
Neutro Abs: 4055 cells/uL (ref 1500–7800)
Neutrophils Relative %: 65.4 %
Platelets: 224 10*3/uL (ref 140–400)
RBC: 4.94 10*6/uL (ref 4.20–5.80)
RDW: 12.8 % (ref 11.0–15.0)
Total Lymphocyte: 26.3 %
WBC: 6.2 10*3/uL (ref 3.8–10.8)

## 2019-07-17 LAB — HEMOGLOBIN A1C W/OUT EAG: Hgb A1c MFr Bld: 5.6 % of total Hgb (ref <5.7)

## 2019-07-17 LAB — LIPID PANEL
Cholesterol: 314 mg/dL — ABNORMAL HIGH (ref <200)
HDL: 36 mg/dL — ABNORMAL LOW (ref >=40)
LDL Cholesterol (Calc): 248 mg/dL (calc) — ABNORMAL HIGH
Non-HDL Cholesterol (Calc): 278 mg/dL (calc) — ABNORMAL HIGH (ref <130)
Total CHOL/HDL Ratio: 8.7 (calc) — ABNORMAL HIGH (ref <5.0)
Triglycerides: 143 mg/dL (ref <150)

## 2019-07-22 ENCOUNTER — Other Ambulatory Visit: Payer: Self-pay

## 2019-07-22 MED ORDER — ROSUVASTATIN CALCIUM 40 MG PO TABS: ORAL_TABLET | ORAL | 0 refills | Status: DC

## 2019-07-22 MED ORDER — FENOFIBRATE 145 MG PO TABS: 145.0000 mg | ORAL_TABLET | Freq: Every day | ORAL | 0 refills | Status: DC

## 2019-08-13 ENCOUNTER — Other Ambulatory Visit: Payer: Self-pay

## 2019-08-13 MED ORDER — TRAZODONE HCL 100 MG PO TABS: 100.0000 mg | ORAL_TABLET | Freq: Every day | ORAL | 0 refills | Status: DC

## 2019-10-14 ENCOUNTER — Telehealth: Payer: Self-pay | Admitting: Internal Medicine

## 2019-10-14 NOTE — Telephone Encounter (Signed)
Daniel Weaver [phone]  [name] called to say he is coming next Thursday for his CPE that he had to cancel in January, he would like repeat his labs if possible before he comes to see if they are any better. Lipid and CMP

## 2019-10-15 NOTE — Telephone Encounter (Signed)
OK- that will be fine 

## 2019-10-15 NOTE — Telephone Encounter (Signed)
Called and schedule lab appointment.

## 2019-10-19 IMAGING — CR DG CHEST 2V
2 series · 2 of 2 positions shown · non-contrast
Comparison: None.

CLINICAL DATA: Cough, fever.

EXAM:
CHEST - 2 VIEW

[w chest pa]
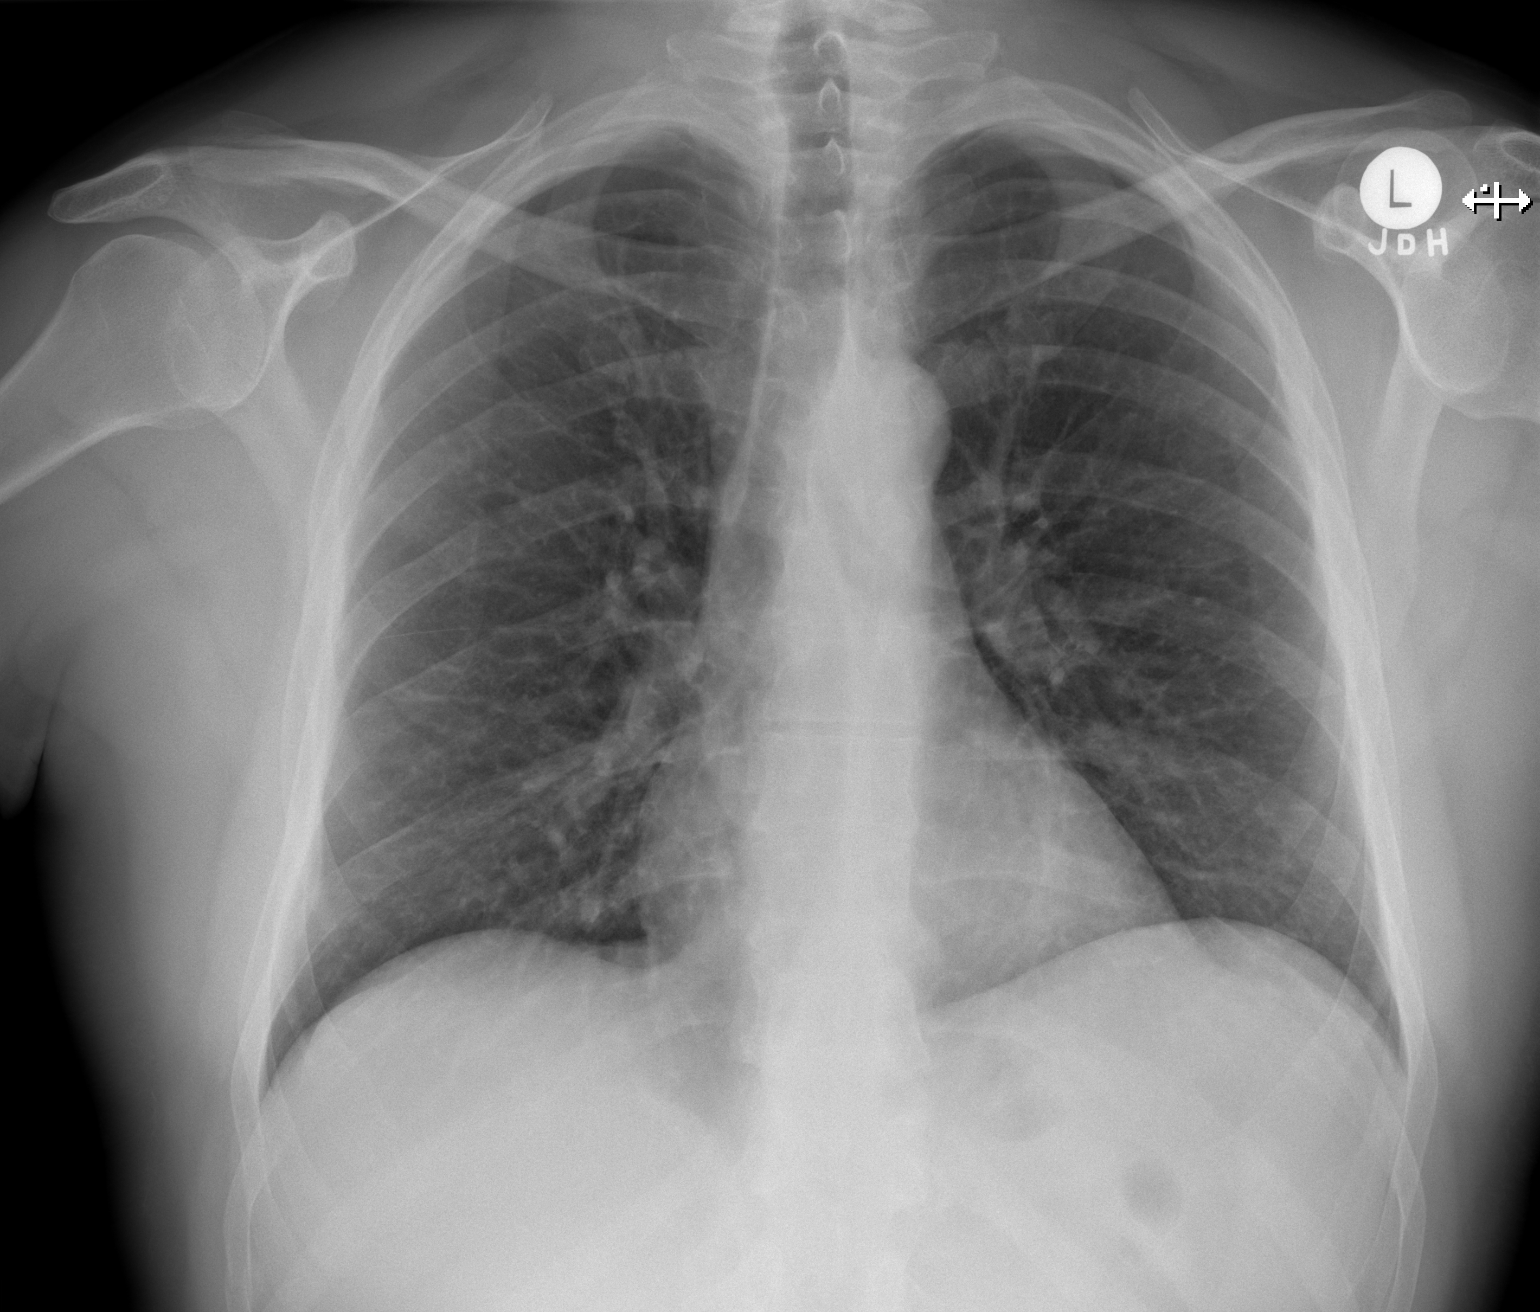

[w chest lat]
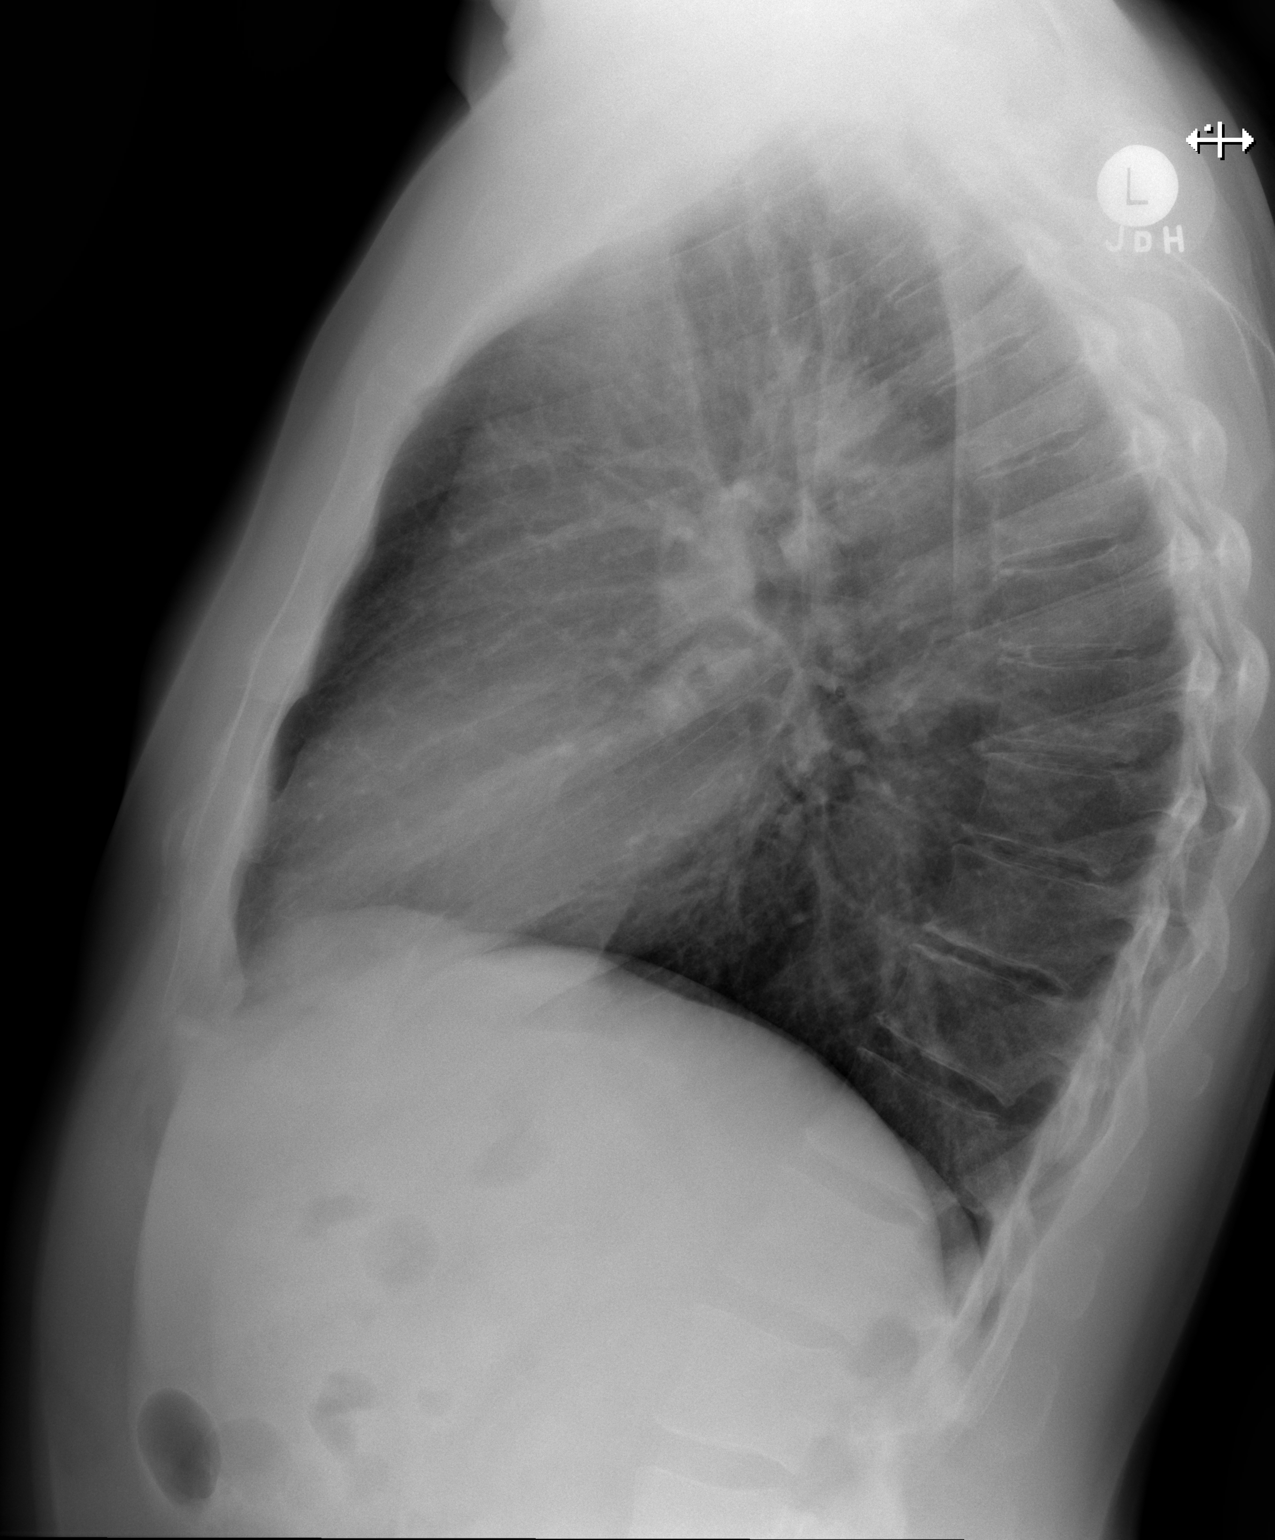

[2 of 2 positions shown; findings below may reference images not displayed]

FINDINGS: The heart size and mediastinal contours are within normal limits.
Both lungs are clear. No pneumothorax or pleural effusion is noted.
The visualized skeletal structures are unremarkable.
IMPRESSION: No active cardiopulmonary disease.

## 2019-10-20 ENCOUNTER — Other Ambulatory Visit: Payer: BC Managed Care – PPO | Admitting: Internal Medicine

## 2019-10-20 ENCOUNTER — Other Ambulatory Visit: Payer: Self-pay

## 2019-10-20 DIAGNOSIS — E782 Mixed hyperlipidemia: Secondary | ICD-10-CM | POA: Diagnosis not present

## 2019-10-20 DIAGNOSIS — F1021 Alcohol dependence, in remission: Secondary | ICD-10-CM | POA: Diagnosis not present

## 2019-10-20 DIAGNOSIS — F411 Generalized anxiety disorder: Secondary | ICD-10-CM

## 2019-10-20 DIAGNOSIS — Z0000 Encounter for general adult medical examination without abnormal findings: Secondary | ICD-10-CM | POA: Diagnosis not present

## 2019-10-20 LAB — COMPLETE METABOLIC PANEL WITH GFR
AG Ratio: 1.8 (calc) (ref 1.0–2.5)
ALT: 21 U/L (ref 9–46)
AST: 21 U/L (ref 10–40)
Albumin: 4.9 g/dL (ref 3.6–5.1)
Alkaline phosphatase (APISO): 57 U/L (ref 36–130)
BUN: 14 mg/dL (ref 7–25)
CO2: 26 mmol/L (ref 20–32)
Calcium: 9.9 mg/dL (ref 8.6–10.3)
Chloride: 102 mmol/L (ref 98–110)
Creat: 1.15 mg/dL (ref 0.60–1.35)
GFR, Est African American: 87 mL/min/{1.73_m2} (ref >=60)
GFR, Est Non African American: 75 mL/min/{1.73_m2} (ref >=60)
Globulin: 2.7 g/dL (calc) (ref 1.9–3.7)
Glucose, Bld: 97 mg/dL (ref 65–99)
Potassium: 4.5 mmol/L (ref 3.5–5.3)
Sodium: 136 mmol/L (ref 135–146)
Total Bilirubin: 0.7 mg/dL (ref 0.2–1.2)
Total Protein: 7.6 g/dL (ref 6.1–8.1)

## 2019-10-20 LAB — LIPID PANEL
Cholesterol: 168 mg/dL (ref <200)
HDL: 41 mg/dL (ref >=40)
LDL Cholesterol (Calc): 108 mg/dL (calc) — ABNORMAL HIGH
Non-HDL Cholesterol (Calc): 127 mg/dL (calc) (ref <130)
Total CHOL/HDL Ratio: 4.1 (calc) (ref <5.0)
Triglycerides: 97 mg/dL (ref <150)

## 2019-10-20 LAB — CBC WITH DIFFERENTIAL/PLATELET
Absolute Monocytes: 360 cells/uL (ref 200–950)
Basophils Absolute: 48 cells/uL (ref 0–200)
Basophils Relative: 0.6 %
Eosinophils Absolute: 144 cells/uL (ref 15–500)
Eosinophils Relative: 1.8 %
HCT: 43.5 % (ref 38.5–50.0)
Hemoglobin: 14.9 g/dL (ref 13.2–17.1)
Lymphs Abs: 1864 cells/uL (ref 850–3900)
MCH: 30.4 pg (ref 27.0–33.0)
MCHC: 34.3 g/dL (ref 32.0–36.0)
MCV: 88.8 fL (ref 80.0–100.0)
MPV: 10.2 fL (ref 7.5–12.5)
Monocytes Relative: 4.5 %
Neutro Abs: 5584 cells/uL (ref 1500–7800)
Neutrophils Relative %: 69.8 %
Platelets: 239 10*3/uL (ref 140–400)
RBC: 4.9 10*6/uL (ref 4.20–5.80)
RDW: 12.6 % (ref 11.0–15.0)
Total Lymphocyte: 23.3 %
WBC: 8 10*3/uL (ref 3.8–10.8)

## 2019-10-23 ENCOUNTER — Other Ambulatory Visit: Payer: Self-pay

## 2019-10-23 ENCOUNTER — Encounter: Payer: Self-pay | Admitting: Internal Medicine

## 2019-10-23 ENCOUNTER — Ambulatory Visit: Payer: BC Managed Care – PPO | Admitting: Internal Medicine

## 2019-10-23 VITALS — BP 100/80 | HR 93 | Temp 98.3°F | Ht 71.0 in | Wt 199.0 lb

## 2019-10-23 DIAGNOSIS — K219 Gastro-esophageal reflux disease without esophagitis: Secondary | ICD-10-CM | POA: Diagnosis not present

## 2019-10-23 DIAGNOSIS — F411 Generalized anxiety disorder: Secondary | ICD-10-CM

## 2019-10-23 DIAGNOSIS — Z0000 Encounter for general adult medical examination without abnormal findings: Secondary | ICD-10-CM

## 2019-10-23 DIAGNOSIS — E782 Mixed hyperlipidemia: Secondary | ICD-10-CM

## 2019-10-23 DIAGNOSIS — F1021 Alcohol dependence, in remission: Secondary | ICD-10-CM

## 2019-10-23 LAB — POCT URINALYSIS DIPSTICK
Appearance: NEGATIVE
Bilirubin, UA: NEGATIVE
Blood, UA: NEGATIVE
Glucose, UA: NEGATIVE
Ketones, UA: NEGATIVE
Leukocytes, UA: NEGATIVE
Nitrite, UA: NEGATIVE
Odor: NEGATIVE
Protein, UA: NEGATIVE
Spec Grav, UA: 1.01 (ref 1.010–1.025)
Urobilinogen, UA: 0.2 E.U./dL
pH, UA: 7 (ref 5.0–8.0)

## 2019-10-23 NOTE — Progress Notes (Signed)
   Subjective:    Patient ID: Daniel Weaver, male    DOB: [birth year], 49 y.o.   MRN: [id number]  HPI 49 year old Male for health maintenance exam and evaluation of medical issues.  He has a history of hyperlipidemia longstanding likely inherited treated with TriCor and Crestor.  History of GE reflux treated with PPI.  He has been on lipid-lowering agents since 2004.  In 2004 was noted he had smoked for 12 years.  Subsequently he quit smoking.  Social history: He is married.  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  Family history: Father with history of heart disease and died of a heart problem.  Mother with history of stroke and history of hypertension as well as hyperlipidemia.  Patient saw  Cardiologist a few years ago because of his family history was changed from Lipitor to Crestor.  He has had 2 admissions for alcoholism.  One was in [place] and the other one was at [employer].  [place].  He attends AA meetings.  Labs reviewed are within normal limits.  His total cholesterol in January was 314 and is now 168.  He has a history of low HDL.  His HDL is now 87 and was 36 in January.  His LDL was 248 in January and is now 66.  He currently is on Crestor 40 mg daily and TriCor 145 mg daily.  He takes Klonopin up to twice daily if needed for anxiety and is on Vistaril 100 mg at bedtime.    Review of Systems no new complaints     Objective:   Physical Exam Blood pressure 100/80, pulse 93, temperature 98.3 degrees pulse oximetry 98% weight 199 pounds.  BMI 27.75.  Skin warm and dry.  Nodes none.  Neck is supple.  No JVD or carotid bruits.  No thyromegaly.  Chest clear to auscultation.  Cardiac exam regular rate and rhythm normal S1 and S2 without murmurs or gallops.  Abdomen soft nondistended without hepatosplenomegaly masses or tenderness.  No lower extremity edema.  Neuro intact without focal  deficits.  Affect thought and judgment appear to be within normal limits.       Assessment & Plan:  Hyperlipidemia-stable on Crestor and TriCor.  History of low HDL  History of alcoholism in recovery  GE reflux treated with PPI  Plan: I would like to see him again in 6 months.  He will have lipid panel liver functions prior to that office visit.  He has had 2 COVID-19 immunizations.  His tetanus immunization is up-to-date.

## 2019-11-02 NOTE — Patient Instructions (Signed)
It was a pleasure to see you today.  Continue current medications.  Watch diet.  Get exercise on a regular basis.  Follow-up in 6 months.

## 2019-11-03 ENCOUNTER — Telehealth: Payer: Self-pay | Admitting: Internal Medicine

## 2019-11-03 ENCOUNTER — Other Ambulatory Visit: Payer: Self-pay

## 2019-11-03 ENCOUNTER — Encounter: Payer: Self-pay | Admitting: Internal Medicine

## 2019-11-03 DIAGNOSIS — E782 Mixed hyperlipidemia: Secondary | ICD-10-CM

## 2019-11-03 MED ORDER — ROSUVASTATIN CALCIUM 40 MG PO TABS: ORAL_TABLET | ORAL | 1 refills | Status: DC

## 2019-11-03 MED ORDER — CLONAZEPAM 0.5 MG PO TABS: ORAL_TABLET | ORAL | 5 refills | Status: DC

## 2019-11-03 MED ORDER — FENOFIBRATE 145 MG PO TABS: 145.0000 mg | ORAL_TABLET | Freq: Every day | ORAL | 1 refills | Status: DC

## 2019-11-03 NOTE — Telephone Encounter (Signed)
Received Fax RX request from  Pharmacy -  [facility] [phone] - [place], [place] - [address] AT [facility] [address] Phone:  [phone]  Fax:  [phone]       Medication -  fenofibrate (TRICOR) 145 MG tablet rosuvastatin (CRESTOR) 40 MG tablet   Last Refill - 07/22/19  Last OV - 10/23/19  Last CPE - 10/23/19  Next Appointment - 04/22/20

## 2019-11-03 NOTE — Telephone Encounter (Signed)
Refill fenofibrate and statin for 6 months

## 2019-11-20 DIAGNOSIS — M25562 Pain in left knee: Secondary | ICD-10-CM | POA: Diagnosis not present

## 2019-11-20 DIAGNOSIS — M25552 Pain in left hip: Secondary | ICD-10-CM | POA: Diagnosis not present

## 2020-02-18 ENCOUNTER — Telehealth: Payer: Self-pay | Admitting: Internal Medicine

## 2020-02-18 MED ORDER — TRAZODONE HCL 100 MG PO TABS: 100.0000 mg | ORAL_TABLET | Freq: Every day | ORAL | 0 refills | Status: DC

## 2020-02-18 NOTE — Telephone Encounter (Signed)
Received Fax RX request from  Pharmacy -  [facility] [phone] - [place], [place] - [address] AT [facility] [address] Phone:  [phone]  Fax:  [phone]       Medication - traZODone (DESYREL) 100 MG tablet   Last Refill - 08/13/19  Last OV - 11/03/19  Last CPE - 11/03/19  Next Appointment - 04/22/20

## 2020-02-18 NOTE — Telephone Encounter (Signed)
Refill until october

## 2020-03-18 ENCOUNTER — Telehealth: Payer: Self-pay | Admitting: Internal Medicine

## 2020-03-18 NOTE — Telephone Encounter (Signed)
Pt wife calling and said that their insurance said that they wont cover fenofibrate (TRICOR) 145 MG tablet now and she said that the pharmacy was going to send something over to this office to see if there is something that can be done to get it covered. Pt only has 1 pill left for tomorrow as well

## 2020-03-24 ENCOUNTER — Other Ambulatory Visit: Payer: Self-pay

## 2020-03-24 MED ORDER — FENOFIBRATE 160 MG PO TABS: 160.0000 mg | ORAL_TABLET | Freq: Every day | ORAL | 1 refills | Status: DC

## 2020-04-19 ENCOUNTER — Other Ambulatory Visit: Payer: BC Managed Care – PPO | Admitting: Internal Medicine

## 2020-04-19 DIAGNOSIS — E782 Mixed hyperlipidemia: Secondary | ICD-10-CM

## 2020-04-20 ENCOUNTER — Other Ambulatory Visit: Payer: BC Managed Care – PPO | Admitting: Internal Medicine

## 2020-04-20 ENCOUNTER — Other Ambulatory Visit: Payer: Self-pay

## 2020-04-20 DIAGNOSIS — E782 Mixed hyperlipidemia: Secondary | ICD-10-CM | POA: Diagnosis not present

## 2020-04-21 LAB — HEPATIC FUNCTION PANEL
AG Ratio: 2 (calc) (ref 1.0–2.5)
ALT: 20 U/L (ref 9–46)
AST: 20 U/L (ref 10–40)
Albumin: 4.9 g/dL (ref 3.6–5.1)
Alkaline phosphatase (APISO): 51 U/L (ref 36–130)
Bilirubin, Direct: 0.1 mg/dL (ref 0.0–0.2)
Globulin: 2.5 g/dL (calc) (ref 1.9–3.7)
Indirect Bilirubin: 0.6 mg/dL (calc) (ref 0.2–1.2)
Total Bilirubin: 0.7 mg/dL (ref 0.2–1.2)
Total Protein: 7.4 g/dL (ref 6.1–8.1)

## 2020-04-21 LAB — LIPID PANEL
Cholesterol: 168 mg/dL (ref <200)
HDL: 45 mg/dL (ref >=40)
LDL Cholesterol (Calc): 104 mg/dL (calc) — ABNORMAL HIGH
Non-HDL Cholesterol (Calc): 123 mg/dL (calc) (ref <130)
Total CHOL/HDL Ratio: 3.7 (calc) (ref <5.0)
Triglycerides: 98 mg/dL (ref <150)

## 2020-04-22 ENCOUNTER — Telehealth: Payer: Self-pay | Admitting: Internal Medicine

## 2020-04-22 ENCOUNTER — Encounter: Payer: Self-pay | Admitting: Internal Medicine

## 2020-04-22 ENCOUNTER — Other Ambulatory Visit: Payer: Self-pay

## 2020-04-22 ENCOUNTER — Ambulatory Visit (INDEPENDENT_AMBULATORY_CARE_PROVIDER_SITE_OTHER): Payer: BC Managed Care – PPO | Admitting: Internal Medicine

## 2020-04-22 VITALS — BP 110/80 | HR 74 | Ht 71.0 in | Wt 194.0 lb

## 2020-04-22 DIAGNOSIS — E782 Mixed hyperlipidemia: Secondary | ICD-10-CM

## 2020-04-22 DIAGNOSIS — F1021 Alcohol dependence, in remission: Secondary | ICD-10-CM | POA: Diagnosis not present

## 2020-04-22 NOTE — Telephone Encounter (Signed)
Confirming with call to pharmacy- left voice mail- that Tricor 160 is replacing the 145 mg dose. Apparebntly insurance will cover 160 but not 145mg . We sent new Rx in September.

## 2020-04-22 NOTE — Progress Notes (Signed)
   Subjective:    Patient ID: Daniel Weaver, male    DOB: May 28, 1971, 49 y.o.   MRN: [id number]  HPI 49 year old Male for 6 month follow up on mixed hyperlipidemia.  Lipid panel is normal except for a very slightly elevated LDL of 104.  Liver functions are normal.  Remains on fenofibrate 160 mg daily and Crestor 40 mg daily.  He is an alcoholic in recovery and is maintained on trazodone 100 mg at bedtime.  He also takes Klonopin up to twice daily as needed for anxiety 0.5 mg.  Continues to do well.  Work and family life are going well.  He has had 2 Covid vaccines.  He declined flu vaccine because he will be participating in a golf tournament this weekend he does not want a sore arm.    Review of Systems see above no new complaints     Objective:   Physical Exam  Blood pressure 110/80 pulse 74 pulse oximetry 97% weight 194 pounds height 5 feet 11 inches  Neck supple.  Chest clear.  No carotid bruits.  Cardiac exam regular rate and rhythm normal S1 and S2.    Assessment & Plan:  Spent 20 minutes speaking with him about these issues.  I am pleased with his lipid panel.  I am pleased he is doing well in recovery.  He is to return in 6 months for health maintenance exam.

## 2020-05-05 ENCOUNTER — Other Ambulatory Visit: Payer: Self-pay | Admitting: Internal Medicine

## 2020-05-09 NOTE — Patient Instructions (Signed)
Lipid panel is within normal limits on statin medications.  Follow-up in 6 months.  Had flu vaccine in the near future.  Have third Covid vaccine when available.

## 2020-06-14 ENCOUNTER — Other Ambulatory Visit: Payer: Self-pay | Admitting: Internal Medicine

## 2020-06-21 NOTE — Telephone Encounter (Signed)
This encounter was created in error - please disregard.

## 2020-07-14 DIAGNOSIS — Z20822 Contact with and (suspected) exposure to covid-19: Secondary | ICD-10-CM | POA: Diagnosis not present

## 2020-07-22 DIAGNOSIS — Z20822 Contact with and (suspected) exposure to covid-19: Secondary | ICD-10-CM | POA: Diagnosis not present

## 2020-09-29 ENCOUNTER — Other Ambulatory Visit: Payer: Self-pay

## 2020-09-29 DIAGNOSIS — F411 Generalized anxiety disorder: Secondary | ICD-10-CM

## 2020-09-29 DIAGNOSIS — E782 Mixed hyperlipidemia: Secondary | ICD-10-CM

## 2020-09-29 DIAGNOSIS — Z0000 Encounter for general adult medical examination without abnormal findings: Secondary | ICD-10-CM

## 2020-09-29 DIAGNOSIS — K219 Gastro-esophageal reflux disease without esophagitis: Secondary | ICD-10-CM

## 2020-09-29 DIAGNOSIS — F1021 Alcohol dependence, in remission: Secondary | ICD-10-CM

## 2020-10-15 DIAGNOSIS — Z20822 Contact with and (suspected) exposure to covid-19: Secondary | ICD-10-CM | POA: Diagnosis not present

## 2020-10-21 ENCOUNTER — Other Ambulatory Visit: Payer: BC Managed Care – PPO | Admitting: Internal Medicine

## 2020-10-22 ENCOUNTER — Other Ambulatory Visit: Payer: Self-pay | Admitting: Internal Medicine

## 2020-10-25 ENCOUNTER — Encounter: Payer: BC Managed Care – PPO | Admitting: Internal Medicine

## 2020-10-26 ENCOUNTER — Encounter: Payer: BC Managed Care – PPO | Admitting: Internal Medicine

## 2020-11-01 ENCOUNTER — Telehealth: Payer: Self-pay | Admitting: Internal Medicine

## 2020-11-03 ENCOUNTER — Other Ambulatory Visit: Payer: Self-pay | Admitting: Internal Medicine

## 2020-11-03 NOTE — Telephone Encounter (Signed)
Patient has reschedule CPE appointment, let him know he must make this appointment to get any more refills.

## 2020-12-15 DIAGNOSIS — R0602 Shortness of breath: Secondary | ICD-10-CM | POA: Diagnosis not present

## 2020-12-15 DIAGNOSIS — U071 COVID-19: Secondary | ICD-10-CM | POA: Diagnosis not present

## 2020-12-15 DIAGNOSIS — R059 Cough, unspecified: Secondary | ICD-10-CM | POA: Diagnosis not present

## 2020-12-15 DIAGNOSIS — R509 Fever, unspecified: Secondary | ICD-10-CM | POA: Diagnosis not present

## 2020-12-28 ENCOUNTER — Other Ambulatory Visit: Payer: BC Managed Care – PPO | Admitting: Internal Medicine

## 2021-01-04 ENCOUNTER — Encounter: Payer: BC Managed Care – PPO | Admitting: Internal Medicine

## 2021-01-20 ENCOUNTER — Other Ambulatory Visit: Payer: Self-pay | Admitting: Internal Medicine

## 2021-02-25 ENCOUNTER — Telehealth: Payer: Self-pay | Admitting: Internal Medicine

## 2021-02-25 NOTE — Telephone Encounter (Signed)
Daniel Weaver [id number]  [name] called to cancel CPE for the 3 rd time, very apologetic, he has a conference in [facility] that he can not get out of. He can come for labs just not CPE. He will be gone September 19.20,21. His labs are on 03/25/2021. Canceled in April and June. Do you want him to get labs and me to schedule office visit we he gets back and then and CPE in January, or do you want to do CPE on a Wednesday?

## 2021-02-25 NOTE — Telephone Encounter (Signed)
Called patient to let him know he needs to keep lab appointment, and that he needs to get in for his CPE by the end of year. I also let him know that DR Lenord Fellers said he needs to make his health a priority and he agrees and stated their is no issue, and to please let him know if there is any cancellations.

## 2021-03-25 ENCOUNTER — Other Ambulatory Visit: Payer: BC Managed Care – PPO | Admitting: Internal Medicine

## 2021-03-25 DIAGNOSIS — Z0000 Encounter for general adult medical examination without abnormal findings: Secondary | ICD-10-CM

## 2021-03-25 DIAGNOSIS — F1021 Alcohol dependence, in remission: Secondary | ICD-10-CM

## 2021-03-25 DIAGNOSIS — E782 Mixed hyperlipidemia: Secondary | ICD-10-CM

## 2021-03-28 ENCOUNTER — Encounter: Payer: BC Managed Care – PPO | Admitting: Internal Medicine

## 2021-04-01 ENCOUNTER — Other Ambulatory Visit: Payer: Self-pay | Admitting: Internal Medicine

## 2021-04-26 ENCOUNTER — Other Ambulatory Visit: Payer: Self-pay | Admitting: Internal Medicine

## 2021-06-06 ENCOUNTER — Other Ambulatory Visit: Payer: Self-pay | Admitting: Internal Medicine

## 2021-06-11 ENCOUNTER — Other Ambulatory Visit: Payer: Self-pay | Admitting: Internal Medicine

## 2021-07-14 ENCOUNTER — Other Ambulatory Visit: Payer: BC Managed Care – PPO | Admitting: Internal Medicine

## 2021-07-14 ENCOUNTER — Other Ambulatory Visit: Payer: Self-pay

## 2021-07-14 DIAGNOSIS — F411 Generalized anxiety disorder: Secondary | ICD-10-CM

## 2021-07-14 DIAGNOSIS — E782 Mixed hyperlipidemia: Secondary | ICD-10-CM

## 2021-07-14 LAB — CBC WITH DIFFERENTIAL/PLATELET
Absolute Monocytes: 432 cells/uL (ref 200–950)
Basophils Absolute: 29 cells/uL (ref 0–200)
Basophils Relative: 0.4 %
Eosinophils Absolute: 281 cells/uL (ref 15–500)
Eosinophils Relative: 3.9 %
HCT: 44.5 % (ref 38.5–50.0)
Hemoglobin: 14.7 g/dL (ref 13.2–17.1)
Lymphs Abs: 1922 cells/uL (ref 850–3900)
MCH: 29.7 pg (ref 27.0–33.0)
MCHC: 33 g/dL (ref 32.0–36.0)
MCV: 89.9 fL (ref 80.0–100.0)
MPV: 10.1 fL (ref 7.5–12.5)
Monocytes Relative: 6 %
Neutro Abs: 4536 cells/uL (ref 1500–7800)
Neutrophils Relative %: 63 %
Platelets: 245 10*3/uL (ref 140–400)
RBC: 4.95 10*6/uL (ref 4.20–5.80)
RDW: 12.4 % (ref 11.0–15.0)
Total Lymphocyte: 26.7 %
WBC: 7.2 10*3/uL (ref 3.8–10.8)

## 2021-07-14 LAB — COMPLETE METABOLIC PANEL WITH GFR
AG Ratio: 2.3 (calc) (ref 1.0–2.5)
ALT: 22 U/L (ref 9–46)
AST: 20 U/L (ref 10–40)
Albumin: 5.1 g/dL (ref 3.6–5.1)
Alkaline phosphatase (APISO): 56 U/L (ref 36–130)
BUN: 12 mg/dL (ref 7–25)
CO2: 27 mmol/L (ref 20–32)
Calcium: 9.9 mg/dL (ref 8.6–10.3)
Chloride: 103 mmol/L (ref 98–110)
Creat: 1.14 mg/dL (ref 0.60–1.29)
Globulin: 2.2 g/dL (calc) (ref 1.9–3.7)
Glucose, Bld: 100 mg/dL — ABNORMAL HIGH (ref 65–99)
Potassium: 4.7 mmol/L (ref 3.5–5.3)
Sodium: 137 mmol/L (ref 135–146)
Total Bilirubin: 0.5 mg/dL (ref 0.2–1.2)
Total Protein: 7.3 g/dL (ref 6.1–8.1)
eGFR: 79 mL/min/{1.73_m2} (ref >=60)

## 2021-07-14 LAB — LIPID PANEL
Cholesterol: 151 mg/dL (ref <200)
HDL: 44 mg/dL (ref >=40)
LDL Cholesterol (Calc): 87 mg/dL (calc)
Non-HDL Cholesterol (Calc): 107 mg/dL (calc) (ref <130)
Total CHOL/HDL Ratio: 3.4 (calc) (ref <5.0)
Triglycerides: 103 mg/dL (ref <150)

## 2021-07-19 ENCOUNTER — Ambulatory Visit (INDEPENDENT_AMBULATORY_CARE_PROVIDER_SITE_OTHER): Payer: BC Managed Care – PPO | Admitting: Internal Medicine

## 2021-07-19 ENCOUNTER — Encounter: Payer: Self-pay | Admitting: Internal Medicine

## 2021-07-19 ENCOUNTER — Other Ambulatory Visit: Payer: Self-pay

## 2021-07-19 VITALS — BP 102/68 | HR 109 | Temp 97.6°F | Ht 71.0 in | Wt 196.0 lb

## 2021-07-19 DIAGNOSIS — E782 Mixed hyperlipidemia: Secondary | ICD-10-CM

## 2021-07-19 DIAGNOSIS — Z1211 Encounter for screening for malignant neoplasm of colon: Secondary | ICD-10-CM | POA: Diagnosis not present

## 2021-07-19 DIAGNOSIS — F1021 Alcohol dependence, in remission: Secondary | ICD-10-CM

## 2021-07-19 DIAGNOSIS — Z8659 Personal history of other mental and behavioral disorders: Secondary | ICD-10-CM | POA: Diagnosis not present

## 2021-07-19 DIAGNOSIS — Z0000 Encounter for general adult medical examination without abnormal findings: Secondary | ICD-10-CM

## 2021-07-19 LAB — POCT URINALYSIS DIPSTICK
Bilirubin, UA: NEGATIVE
Blood, UA: NEGATIVE
Glucose, UA: NEGATIVE
Ketones, UA: NEGATIVE
Leukocytes, UA: NEGATIVE
Nitrite, UA: NEGATIVE
Protein, UA: NEGATIVE
Spec Grav, UA: 1.02 (ref 1.010–1.025)
Urobilinogen, UA: 0.2 E.U./dL
pH, UA: 6 (ref 5.0–8.0)

## 2021-07-19 NOTE — Patient Instructions (Addendum)
It was a pleasure to see you today. Continue same medications. RTC one year.  Referral placed for colon cancer screening.

## 2021-07-19 NOTE — Progress Notes (Signed)
° °  Subjective:    Patient ID: Daniel Weaver, male    DOB: [birth year], 51 y.o.   MRN: [id number]  HPI 51 year old Male for health maintenance exam and evaluation of medical issues.  He has a history of hyperlipidemia, longstanding which is likely inherited currently treated with Tricor and Crestor.  History of GE reflux treated with PPI.  Has been on lipid-lowering agent since 2004.  In 2004, it was noted he had smoked for 12 years.  Subsequently he quit smoking.  Social history: He is married.  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  Family history: Father with history of heart disease and died of a heart problem.  Mother with history of stroke, history of hypertension as well as hyperlipidemia.  He has had 2 admissions for alcoholism.  One was in [place] and the other one was at [facility] in [place].  He is doing well in recovery at the present time.  His lipid panel was completely normal on fenofibrate and rosuvastatin 40 mg daily.  Remains on trazodone 100 mg at bedtime.  Takes Klonopin if needed for anxiety     Review of Systems no new complaints     Objective:   Physical Exam BP 102/68, pulse 109 regular: T 97.6, pulse ox 97% Weight 196 pounds, BMI 27.34 Skin: Warm and dry.  No cervical adenopathy, thyromegaly or carotid bruits.  Chest is clear to auscultation.  Cardiac exam: Regular rate and rhythm.  No murmurs or gallops appreciated.  Abdomen soft nondistended without hepatosplenomegaly masses or tenderness.  No lower extremity pitting edema.  Affect thought and judgment appear to be normal.      Assessment & Plan:   Normal health maintenance exam  GE reflux-takes Prilosec if needed  Hyperlipidemia-stable on current 2 drug regimen/fenofibrate and Crestor  Alcoholism in recovery-currently on trazodone 100 mg daily and Klonopin up to twice daily as needed  Health  maintenance: He had [place] booster in November 2022 and flu vaccine in October 2022.  Tetanus immunization is up-to-date.  Due for colon cancer screening.  Plan: Referral placed for colonoscopy.  Continue current medications.  Return in 1 year or as needed.

## 2021-07-25 ENCOUNTER — Other Ambulatory Visit: Payer: Self-pay | Admitting: Internal Medicine

## 2021-07-29 ENCOUNTER — Telehealth: Payer: Self-pay

## 2021-07-29 NOTE — Telephone Encounter (Signed)
PA for fenofibrate initiated today on covermymeds. Allow 72 hours for determination

## 2021-08-01 ENCOUNTER — Telehealth: Payer: Self-pay

## 2021-08-01 DIAGNOSIS — E782 Mixed hyperlipidemia: Secondary | ICD-10-CM

## 2021-08-01 MED ORDER — FENOFIBRATE 160 MG PO TABS: 160.0000 mg | ORAL_TABLET | Freq: Every day | ORAL | 1 refills | Status: DC

## 2021-08-01 NOTE — Telephone Encounter (Signed)
Fenofibrate 145 not covered by insurance. Patient must try fenofibrate 48mg , fenofibrate 54 mg/fenofibrate 160mg  generic tricor or generic lofibra

## 2021-08-01 NOTE — Telephone Encounter (Signed)
New med sent in

## 2021-08-12 ENCOUNTER — Other Ambulatory Visit: Payer: Self-pay | Admitting: Internal Medicine

## 2021-09-13 ENCOUNTER — Ambulatory Visit (AMBULATORY_SURGERY_CENTER): Payer: BC Managed Care – PPO | Admitting: *Deleted

## 2021-09-13 ENCOUNTER — Other Ambulatory Visit: Payer: Self-pay

## 2021-09-13 VITALS — Ht 71.0 in | Wt 197.0 lb

## 2021-09-13 DIAGNOSIS — Z1211 Encounter for screening for malignant neoplasm of colon: Secondary | ICD-10-CM

## 2021-09-13 MED ORDER — NA SULFATE-K SULFATE-MG SULF 17.5-3.13-1.6 GM/177ML PO SOLN: 1.0000 | Freq: Once | ORAL | 0 refills | Status: AC

## 2021-09-13 NOTE — Progress Notes (Signed)
No egg or soy allergy known to patient  °No issues known to pt with past sedation with any surgeries or procedures °Patient denies ever being told they had issues or difficulty with intubation  °No FH of Malignant Hyperthermia °Pt is not on diet pills °Pt is not on  home 02  °Pt is not on blood thinners  °Pt denies issues with constipation  °No A fib or A flutter ° °Pt is fully vaccinated  for Covid  ° ° NO PA's for preps discussed with pt In PV today  °Discussed with pt there will be an out-of-pocket cost for prep and that varies from $0 to 70 +  dollars - pt verbalized understanding  ° °Due to the COVID-19 pandemic we are asking patients to follow certain guidelines in PV and the [facility]   °Pt aware of COVID protocols and [facility] guidelines  ° °PV completed over the phone. Pt verified name, DOB, address and insurance during PV today.  °Pt mailed instruction packet with copy of consent form to read and not return, and instructions.  °Pt encouraged to call with questions or issues.  °If pt has My chart, procedure instructions sent via My Chart  ° °

## 2021-09-27 ENCOUNTER — Other Ambulatory Visit: Payer: Self-pay

## 2021-09-27 ENCOUNTER — Ambulatory Visit (AMBULATORY_SURGERY_CENTER): Payer: BC Managed Care – PPO | Admitting: Gastroenterology

## 2021-09-27 ENCOUNTER — Encounter: Payer: Self-pay | Admitting: Gastroenterology

## 2021-09-27 VITALS — BP 117/68 | HR 72 | Temp 98.7°F | Resp 14 | Ht 71.0 in | Wt 197.0 lb

## 2021-09-27 DIAGNOSIS — Z1211 Encounter for screening for malignant neoplasm of colon: Secondary | ICD-10-CM | POA: Diagnosis not present

## 2021-09-27 MED ORDER — SODIUM CHLORIDE 0.9 % IV SOLN: 500.0000 mL | Freq: Once | INTRAVENOUS | Status: DC

## 2021-09-27 NOTE — Progress Notes (Signed)
[facility] History and Physical ? ? ?Primary Care Physician:  Elby Showers, MD ? ? ?Reason for Procedure:  Colorectal cancer screening ? ?Plan:    Screening colonoscopy with possible interventions as needed ? ? ? ? ?HPI: Daniel Weaver is a very pleasant 51 y.o. male here for screening colonoscopy. ?Denies any nausea, vomiting, abdominal pain, melena or bright red blood per rectum ? ?The risks and benefits as well as alternatives of endoscopic procedure(s) have been discussed and reviewed. All questions answered. The patient agrees to proceed. ? ? ? ?Past Medical History:  ?Diagnosis Date  ? Erythema multiforme 07/19/2018  ? FUO (fever of unknown origin) 07/19/2018  ? GERD (gastroesophageal reflux disease)   ? HTN (hypertension)   ? borderline- no meds  ? Hyperlipidemia   ? ? ?Past Surgical History:  ?Procedure Laterality Date  ? LASIK  07/11/2007  ? MOUTH SURGERY    ? growth in mouth that was biopsied  ? WISDOM TOOTH EXTRACTION    ? ? ?Prior to Admission medications   ?Medication Sig Start Date End Date Taking? Authorizing Provider  ?clonazePAM (KLONOPIN) 0.5 MG tablet TAKE 1/2 TO 1 TABLET BY MOUTH TWICE DAILY AS NEEDED FOR ANXIETY ?Patient taking differently: Take 0.25 mg by mouth daily. TAKE 1/2 TO 1 TABLET BY MOUTH TWICE DAILY AS NEEDED FOR ANXIETY 08/12/21  Yes Baxley, Cresenciano Lick, MD  ?fenofibrate 160 MG tablet Take 1 tablet (160 mg total) by mouth daily. 08/01/21  Yes Baxley, Cresenciano Lick, MD  ?omeprazole (PRILOSEC) 10 MG capsule Take 10 mg by mouth daily.   Yes [provider]  ?rosuvastatin (CRESTOR) 40 MG tablet TAKE 1 TABLET(40 MG) BY MOUTH DAILY 07/25/21  Yes Baxley, Cresenciano Lick, MD  ?traZODone (DESYREL) 100 MG tablet TAKE 1 TABLET(100 MG) BY MOUTH AT BEDTIME ?Patient taking differently: Take 50 mg by mouth at bedtime. 04/26/21  Yes Baxley, Cresenciano Lick, MD  ?Multiple Vitamin (MULTIVITAMIN) tablet Take 1 tablet by mouth daily.    [provider]  ? ? ?Current Outpatient Medications   ?Medication Sig Dispense Refill  ? clonazePAM (KLONOPIN) 0.5 MG tablet TAKE 1/2 TO 1 TABLET BY MOUTH TWICE DAILY AS NEEDED FOR ANXIETY (Patient taking differently: Take 0.25 mg by mouth daily. TAKE 1/2 TO 1 TABLET BY MOUTH TWICE DAILY AS NEEDED FOR ANXIETY) 60 tablet 3  ? fenofibrate 160 MG tablet Take 1 tablet (160 mg total) by mouth daily. 90 tablet 1  ? omeprazole (PRILOSEC) 10 MG capsule Take 10 mg by mouth daily.    ? rosuvastatin (CRESTOR) 40 MG tablet TAKE 1 TABLET(40 MG) BY MOUTH DAILY 90 tablet 3  ? traZODone (DESYREL) 100 MG tablet TAKE 1 TABLET(100 MG) BY MOUTH AT BEDTIME (Patient taking differently: Take 50 mg by mouth at bedtime.) 90 tablet 0  ? Multiple Vitamin (MULTIVITAMIN) tablet Take 1 tablet by mouth daily.    ? ?Current Facility-Administered Medications  ?Medication Dose Route Frequency Provider Last Rate Last Admin  ? 0.9 %  sodium chloride infusion  500 mL Intravenous Once Diyana Starrett, Venia Minks, MD      ? ? ?Allergies as of 09/27/2021  ? (No Known Allergies)  ? ? ?Family History  ?Problem Relation Age of Onset  ? Heart attack Father 61  ? Heart attack Maternal Grandfather 64  ? Colon cancer Neg Hx   ? Colon polyps Neg Hx   ? Esophageal cancer Neg Hx   ? Rectal cancer Neg Hx   ? Stomach cancer Neg Hx   ? ? ?  Social History  ? ?Socioeconomic History  ? Marital status: Married  ?  Spouse name: Not on file  ? Number of children: 2  ? Years of education: Not on file  ? Highest education level: Not on file  ?Occupational History  ? Occupation: makes Designer, industrial/product  ?  Employer: [employer]  ?Tobacco Use  ? Smoking status: Some Days  ?  Packs/day: 0.50  ?  Years: 15.00  ?  Pack years: 7.50  ?  Types: Cigarettes  ?  Last attempt to quit: 05/17/2004  ?  Years since quitting: 17.3  ? Smokeless tobacco: Never  ? Tobacco comments:  ?  nicotine gum prn  ?Vaping Use  ? Vaping Use: Never used  ?Substance and Sexual Activity  ? Alcohol use: Yes  ?  Comment: socially  ? Drug use: No  ? Sexual  activity: Not on file  ?Other Topics Concern  ? Not on file  ?Social History Narrative  ? Social history: He is married.  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  ?    ? Family history: Father with history of heart disease and died of a heart problem.  Mother with history of stroke and history of hypertension as well as hyperlipidemia.  ? ?Social Determinants of Health  ? ?Financial Resource Strain: Not on file  ?Food Insecurity: Not on file  ?Transportation Needs: Not on file  ?Physical Activity: Not on file  ?Stress: Not on file  ?Social Connections: Not on file  ?Intimate Partner Violence: Not on file  ? ? ?Review of Systems: ? ?All other review of systems negative except as mentioned in the HPI. ? ?Physical Exam: ?Vital signs in last 24 hours: ?BP 99/64   Pulse 82   Temp 98.7 ?F (37.1 ?C) (Temporal)   Ht 5\' 11"  (1.803 m)   Wt 197 lb (89.4 kg)   SpO2 96%   BMI 27.48 kg/m?  ?General:   Alert, NAD ?Lungs:  Clear .   ?Heart:  Regular rate and rhythm ?Abdomen:  Soft, nontender and nondistended. ?Neuro/Psych:  Alert and cooperative. Normal mood and affect. A and O x 3 ? ?Reviewed labs, radiology imaging, old records and pertinent past GI work up ? ?Patient is appropriate for planned procedure(s) and anesthesia in an ambulatory setting ? ? ?K. Denzil Magnuson , MD ?[phone]  ? ? ?  ?

## 2021-09-27 NOTE — Progress Notes (Signed)
VS completed by CW.   Pt's states no medical or surgical changes since previsit or office visit.  

## 2021-09-27 NOTE — Patient Instructions (Signed)
Handouts provided on diverticulosis and hemorrhoids.  ? ?Avoid or limit use of aspirin, ibuprofen, naproxen, or other non-steriodal anti-inflammatory drugs (NSAIDs). You may use Tylenol if needed for mild pain or fever.  ? ? ?YOU HAD AN ENDOSCOPIC PROCEDURE TODAY AT THE [facility]:   Refer to the procedure report that was given to you for any specific questions about what was found during the examination.  If the procedure report does not answer your questions, please call your gastroenterologist to clarify.  If you requested that your care partner not be given the details of your procedure findings, then the procedure report has been included in a sealed envelope for you to review at your convenience later. ? ?YOU SHOULD EXPECT: Some feelings of bloating in the abdomen. Passage of more gas than usual.  Walking can help get rid of the air that was put into your GI tract during the procedure and reduce the bloating. If you had a lower endoscopy (such as a colonoscopy or flexible sigmoidoscopy) you may notice spotting of blood in your stool or on the toilet paper. If you underwent a bowel prep for your procedure, you may not have a normal bowel movement for a few days. ? ?Please Note:  You might notice some irritation and congestion in your nose or some drainage.  This is from the oxygen used during your procedure.  There is no need for concern and it should clear up in a day or so. ? ?SYMPTOMS TO REPORT IMMEDIATELY: ? ?Following lower endoscopy (colonoscopy or flexible sigmoidoscopy): ? Excessive amounts of blood in the stool ? Significant tenderness or worsening of abdominal pains ? Swelling of the abdomen that is new, acute ? Fever of 100?F or higher ? ?For urgent or emergent issues, a gastroenterologist can be reached at any hour by calling [phone]. ?Do not use MyChart messaging for urgent concerns.  ? ? ?DIET:  We do recommend a small meal at first, but then you may proceed to your regular  diet.  Drink plenty of fluids but you should avoid alcoholic beverages for 24 hours. ? ?ACTIVITY:  You should plan to take it easy for the rest of today and you should NOT DRIVE or use heavy machinery until tomorrow (because of the sedation medicines used during the test).   ? ?FOLLOW UP: ?Our staff will call the number listed on your records 48-72 hours following your procedure to check on you and address any questions or concerns that you may have regarding the information given to you following your procedure. If we do not reach you, we will leave a message.  We will attempt to reach you two times.  During this call, we will ask if you have developed any symptoms of COVID 19. If you develop any symptoms (ie: fever, flu-like symptoms, shortness of breath, cough etc.) before then, please call [phone].  If you test positive for Covid 19 in the 2 weeks post procedure, please call and report this information to [place].   ? ?If any biopsies were taken you will be contacted by phone or by letter within the next 1-3 weeks.  Please call us at [phone] if you have not heard about the biopsies in 3 weeks.  ? ? ?SIGNATURES/CONFIDENTIALITY: ?You and/or your care partner have signed paperwork which will be entered into your electronic medical record.  These signatures attest to the fact that that the information above on your After Visit Summary has been reviewed and is understood.  Full responsibility of the confidentiality of this discharge information lies with you and/or your care-partner. ? ?

## 2021-09-27 NOTE — Progress Notes (Signed)
Report given to PACU, vss 

## 2021-09-27 NOTE — Op Note (Signed)
[facility] ?Patient Name: Daniel Weaver ?Procedure Date: 09/27/2021 2:31 PM ?MRN: [id number] ?Endoscopist: Napoleon Form , MD ?Age: 51 ?Referring MD:  ?Date of Birth: 1971/01/01 ?Gender: Male ?Account #: [id number] ?Procedure:                Colonoscopy ?Indications:              Screening for colorectal malignant neoplasm ?Medicines:                Monitored Anesthesia Care ?Procedure:                Pre-Anesthesia Assessment: ?                          - Prior to the procedure, a History and Physical  ?                          was performed, and patient medications and  ?                          allergies were reviewed. The patient's tolerance of  ?                          previous anesthesia was also reviewed. The risks  ?                          and benefits of the procedure and the sedation  ?                          options and risks were discussed with the patient.  ?                          All questions were answered, and informed consent  ?                          was obtained. Prior Anticoagulants: The patient has  ?                          taken no previous anticoagulant or antiplatelet  ?                          agents. ASA Grade Assessment: II - A patient with  ?                          mild systemic disease. After reviewing the risks  ?                          and benefits, the patient was deemed in  ?                          satisfactory condition to undergo the procedure. ?                          After obtaining informed consent, the colonoscope  ?  was passed under direct vision. Throughout the  ?                          procedure, the patient's blood pressure, pulse, and  ?                          oxygen saturations were monitored continuously. The  ?                          PCF-HQ190L Colonoscope was introduced through the  ?                          anus and advanced to the the cecum, identified by  ?                          appendiceal  orifice and ileocecal valve. The  ?                          colonoscopy was performed without difficulty. The  ?                          patient tolerated the procedure well. The quality  ?                          of the bowel preparation was excellent. The  ?                          ileocecal valve, appendiceal orifice, and rectum  ?                          were photographed. ?Scope In: 2:40:10 PM ?Scope Out: 2:55:55 PM ?Scope Withdrawal Time: 0 hours 9 minutes 28 seconds  ?Total Procedure Duration: 0 hours 15 minutes 45 seconds  ?Findings:                 The perianal and digital rectal examinations were  ?                          normal. ?                          The terminal ileum contained a single localized  ?                          non-bleeding erosion. ?                          A few small-mouthed diverticula were found in the  ?                          sigmoid colon. ?                          Non-bleeding external and internal hemorrhoids were  ?  found during retroflexion. The hemorrhoids were  ?                          small. ?                          The exam was otherwise without abnormality. ?Complications:            No immediate complications. ?Estimated Blood Loss:     Estimated blood loss: none. ?Impression:               - A single erosion in the terminal ileum. ?                          - Diverticulosis in the sigmoid colon. ?                          - Non-bleeding external and internal hemorrhoids. ?                          - The examination was otherwise normal. ?                          - No specimens collected. ?Recommendation:           - Patient has a contact number available for  ?                          emergencies. The signs and symptoms of potential  ?                          delayed complications were discussed with the  ?                          patient. Return to normal activities tomorrow.  ?                          Written discharge  instructions were provided to the  ?                          patient. ?                          - Resume previous diet. ?                          - Continue present medications. ?                          - Repeat colonoscopy in 10 years for screening  ?                          purposes. ?                          - Avoid or limit use of ibuprofen, naproxen, or  ?  other non-steroidal anti-inflammatory drugs. ?Napoleon Form, MD ?09/27/2021 3:01:50 PM ?This report has been signed electronically. ?

## 2021-09-29 ENCOUNTER — Telehealth: Payer: Self-pay

## 2021-09-29 NOTE — Telephone Encounter (Signed)
Left VM

## 2021-10-19 ENCOUNTER — Other Ambulatory Visit: Payer: Self-pay | Admitting: Internal Medicine

## 2021-11-29 ENCOUNTER — Other Ambulatory Visit: Payer: Self-pay | Admitting: Internal Medicine

## 2021-11-29 DIAGNOSIS — E782 Mixed hyperlipidemia: Secondary | ICD-10-CM

## 2022-01-17 ENCOUNTER — Other Ambulatory Visit: Payer: Self-pay | Admitting: Internal Medicine

## 2022-01-17 DIAGNOSIS — E782 Mixed hyperlipidemia: Secondary | ICD-10-CM

## 2022-02-14 DIAGNOSIS — D2261 Melanocytic nevi of right upper limb, including shoulder: Secondary | ICD-10-CM | POA: Diagnosis not present

## 2022-02-14 DIAGNOSIS — L218 Other seborrheic dermatitis: Secondary | ICD-10-CM | POA: Diagnosis not present

## 2022-02-14 DIAGNOSIS — L720 Epidermal cyst: Secondary | ICD-10-CM | POA: Diagnosis not present

## 2022-02-14 DIAGNOSIS — L820 Inflamed seborrheic keratosis: Secondary | ICD-10-CM | POA: Diagnosis not present

## 2022-02-14 DIAGNOSIS — L814 Other melanin hyperpigmentation: Secondary | ICD-10-CM | POA: Diagnosis not present

## 2022-03-15 ENCOUNTER — Other Ambulatory Visit: Payer: Self-pay | Admitting: Internal Medicine

## 2022-03-20 ENCOUNTER — Telehealth: Payer: Self-pay | Admitting: Internal Medicine

## 2022-03-20 ENCOUNTER — Telehealth: Payer: BC Managed Care – PPO | Admitting: Internal Medicine

## 2022-03-20 ENCOUNTER — Encounter: Payer: Self-pay | Admitting: Internal Medicine

## 2022-03-20 VITALS — BP 146/83 | Temp 98.9°F | Wt 195.0 lb

## 2022-03-20 DIAGNOSIS — U071 COVID-19: Secondary | ICD-10-CM

## 2022-03-20 MED ORDER — NIRMATRELVIR/RITONAVIR (PAXLOVID)TABLET: 3.0000 | ORAL_TABLET | Freq: Two times a day (BID) | ORAL | 0 refills | Status: AC

## 2022-03-20 NOTE — Telephone Encounter (Signed)
Daniel Weaver [id number]  [name] called to say he tested positive for COVID last night, he started getting sick on Saturday, he has body aches, sore throat, headache, cough, runny nose and a fever (99.6). I scheduled him for a video visit at 12.00 today

## 2022-03-20 NOTE — Progress Notes (Signed)
   Subjective:    Patient ID: Daniel Weaver, male    DOB: [birth year], 51 y.o.   MRN: [id number]  HPI 51 year old Male seen today by interactive audio and video telecommunications.  He is at his home and I am at my office.  He is identified using 2 identifiers as Daniel Weaver, a patient in this practice.  He is agreeable to visit in this format today.  Patient says that on Saturday evening September 9, he noticed some myalgias.  He began to feel a bit worse on Sunday, September 10.  He developed some headache, scratchy throat, fatigue.  He began to have a phlegmy cough with some discolored sputum production.  He did a COVID test and it was positive.  He notes that he had COVID previously about a year and a half ago.  Records indicate he has had 3 vaccines for COVID.  He had Moderna booster in November 2022.  The other 2 vaccines were in 2021.  He has a longstanding history of  hyperlipidemia.  He to use rosuvastatin and fenofibrate.  History of GE reflux for which he has taken low dose Prilosec.  He takes Klonopin and Desyrel.    Review of Systems no shortness of breath, no nausea or vomiting     Objective:   Physical Exam  He reports his temperature to be 99.6 degrees at the present time.  He is seen virtually and looks a bit pale but does not appear to be dyspneic.  He looks a bit fatigued.  He appears slightly pale.      Assessment & Plan:   Acute COVID-19 virus infection  Hyperlipidemia treated with statin  Plan: Treatment options were discussed including symptomatic treatment only or treatment with Paxlovid.  We have decided that he will take Paxlovid.  He has normal renal functions.    Prescription for full strength Paxlovid sent to pharmacy.    We also discussed cough medication.  It is okay for him to take Sudafed.  He does not feel that he needs any narcotic cough medication at this point in time.  He is to stay well-hydrated and to walk around some to  prevent atelectasis of his lungs since he has a cough.  He will monitor his symptoms and let me know if symptoms worsen.  He needs to quarantine at home for 5 days.  Time spent with this encounter is 20 minutes

## 2022-03-20 NOTE — Patient Instructions (Signed)
Paxlovid regular strength prescribed to take as directed.  Monitor pulse oximetry.  Stay well-hydrated.  Walk some to prevent atelectasis.  May take Sudafed for congestion and Tylenol if needed for fever.  Quarantine for 5 days.  Call if not improving in the few days or if symptoms are worsening.

## 2022-04-17 ENCOUNTER — Telehealth: Payer: Self-pay | Admitting: Internal Medicine

## 2022-04-17 NOTE — Telephone Encounter (Signed)
Received Fax RX request from  [facility] [place], [place] - [address] Phone: [phone]  Fax: [phone]      Medication - traZODone (DESYREL) 100 MG tablet   Last Refill - 01/17/2022  Last OV - 03/20/2022  Last CPE - 07/19/21  Next Appointment - 07/21/22

## 2022-04-18 MED ORDER — TRAZODONE HCL 100 MG PO TABS
ORAL_TABLET | ORAL | 1 refills | Status: DC
Start: 2022-04-18 — End: 2024-05-22

## 2022-04-18 NOTE — Telephone Encounter (Signed)
Rx sent to the pharmacy.

## 2022-07-14 ENCOUNTER — Other Ambulatory Visit: Payer: BC Managed Care – PPO

## 2022-07-14 DIAGNOSIS — Z125 Encounter for screening for malignant neoplasm of prostate: Secondary | ICD-10-CM

## 2022-07-14 DIAGNOSIS — E782 Mixed hyperlipidemia: Secondary | ICD-10-CM

## 2022-07-14 DIAGNOSIS — Z136 Encounter for screening for cardiovascular disorders: Secondary | ICD-10-CM

## 2022-07-16 ENCOUNTER — Other Ambulatory Visit: Payer: Self-pay | Admitting: Internal Medicine

## 2022-07-17 ENCOUNTER — Other Ambulatory Visit: Payer: Self-pay | Admitting: Internal Medicine

## 2022-07-21 ENCOUNTER — Encounter: Payer: BC Managed Care – PPO | Admitting: Internal Medicine

## 2022-08-14 ENCOUNTER — Other Ambulatory Visit: Payer: Self-pay | Admitting: Internal Medicine

## 2022-09-06 ENCOUNTER — Telehealth: Payer: Self-pay

## 2022-09-06 NOTE — Telephone Encounter (Signed)
Patient informed on message

## 2022-09-06 NOTE — Telephone Encounter (Signed)
Patient asked if appt scheduled on 3/4 can be virtual,  I informed the patient I would send message and we would get back to him.

## 2022-09-11 ENCOUNTER — Ambulatory Visit: Payer: BC Managed Care – PPO | Admitting: Internal Medicine

## 2022-09-11 ENCOUNTER — Encounter: Payer: Self-pay | Admitting: Internal Medicine

## 2022-09-11 VITALS — BP 136/82 | HR 98 | Temp 98.1°F | Ht 71.0 in | Wt 197.1 lb

## 2022-09-11 DIAGNOSIS — E782 Mixed hyperlipidemia: Secondary | ICD-10-CM | POA: Diagnosis not present

## 2022-09-11 MED ORDER — FENOFIBRATE 160 MG PO TABS: 160.0000 mg | ORAL_TABLET | Freq: Every day | ORAL | 3 refills | Status: DC

## 2022-09-11 NOTE — Patient Instructions (Addendum)
Discussion regarding possible Adult Attention Deficit Disorder.  Suggested evaluation by [facility].  Insurance coverage issues with dosage of Tricor that was sent in previously.  We will try Tricor 160 mg daily which I believe is covered by his insurance plan rather than Tricor 145 mg daily.  He has upcoming health maintenance exam in May and we can follow-up with lab/lipid panel at that time.

## 2022-09-11 NOTE — Progress Notes (Signed)
Patient Care Team: Elby Showers, MD as PCP - General (Internal Medicine)  Visit Date: 09/11/22  Subjective:    Patient ID: Daniel Weaver , Male   DOB: 29-Nov-1970, 52 y.o.    MRN: [id number]   52 y.o. Male presents today for medication counseling. Patient has a past medical history of erythema multiforme, GERD, hypertension, hyperlipidemia.  History of mixed hyperlipidemia.  He is on rosuvastatin 40 mg daily and fenofibrate 160 mg daily.  He is having difficulty with insurance coverage for Tricor 145 mg. Believes it has to do with tablet dosage. Ran out of tablets 5 days ago. Still taking Crestor.  CPE scheduled for May 2024.  We have researched this and found that insurance will cover fenofibrate 160 mg tablets but not 145 mg tablets.  Having trouble when reviewing detailed documents recently. Concerned that he has adult ADD. Tried taking son's ADD stimulant medication and felt that it helped him.  He was able to concentrate more clearly reviewing detailed documents.  Also, having some right-sided back pain after golfing. Taking OTC NSAID medication with inadequate relief.  Is interested in muscle relaxant or prescription medication.  I think he would best be served with some physical therapy.  He does play a lot of golf.  He has no radicular symptoms or weakness in the right lower extremity.   Past Medical History:  Diagnosis Date   Erythema multiforme 07/19/2018   FUO (fever of unknown origin) 07/19/2018   GERD (gastroesophageal reflux disease)    HTN (hypertension)    borderline- no meds   Hyperlipidemia      Family History  Problem Relation Age of Onset   Heart attack Father 74   Heart attack Maternal Grandfather 64   Colon cancer Neg Hx    Colon polyps Neg Hx    Esophageal cancer Neg Hx    Rectal cancer Neg Hx    Stomach cancer Neg Hx     Social History   Social History Narrative   Social history: He is married.  He is a Writer of [employer].   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       Family history: Father with history of heart disease and died of a heart problem.  Mother with history of stroke and history of hypertension as well as hyperlipidemia.      Review of Systems  Constitutional:  Negative for fever and malaise/fatigue.  HENT:  Negative for congestion.   Eyes:  Negative for blurred vision.  Respiratory:  Negative for cough and shortness of breath.   Cardiovascular:  Negative for chest pain, palpitations and leg swelling.  Gastrointestinal:  Negative for vomiting.  Musculoskeletal:  Positive for back pain (Right-sided, after playing golf).  Skin:  Negative for rash.  Neurological:  Negative for loss of consciousness and headaches.        Objective:   Vitals: BP 136/82   Pulse 98   Temp 98.1 F (36.7 C) (Tympanic)   Ht '5\' 11"'$  (1.803 m)   Wt 197 lb 1.9 oz (89.4 kg)   SpO2 98%   BMI 27.49 kg/m    Physical Exam Vitals and nursing note reviewed.  Constitutional:      General: He is not in acute distress.    Appearance: Normal appearance. He is not ill-appearing.  HENT:     Head: Normocephalic and atraumatic.  Pulmonary:     Effort: Pulmonary effort is normal.  Musculoskeletal:  Comments: Muscle strength normal in right lower extremity.  Skin:    General: Skin is warm and dry.  Neurological:     Mental Status: He is alert and oriented to person, place, and time. Mental status is at baseline.     Comments: Straight leg raising test negative on right.  Psychiatric:        Mood and Affect: Mood normal.        Behavior: Behavior normal.        Thought Content: Thought content normal.        Judgment: Judgment normal.       Results:       Labs:       Component Value Date/Time   NA 137 07/14/2021 0926   K 4.7 07/14/2021 0926   CL 103 07/14/2021 0926   CO2 27 07/14/2021 0926   GLUCOSE 100 (H) 07/14/2021 0926   BUN 12 07/14/2021 0926    CREATININE 1.14 07/14/2021 0926   CALCIUM 9.9 07/14/2021 0926   PROT 7.3 07/14/2021 0926   ALBUMIN 4.4 01/25/2015 0940   AST 20 07/14/2021 0926   ALT 22 07/14/2021 0926   ALKPHOS 52 01/25/2015 0940   BILITOT 0.5 07/14/2021 0926   GFRNONAA 75 10/20/2019 1004   GFRAA 87 10/20/2019 1004     Lab Results  Component Value Date   WBC 7.2 07/14/2021   HGB 14.7 07/14/2021   HCT 44.5 07/14/2021   MCV 89.9 07/14/2021   PLT 245 07/14/2021    Lab Results  Component Value Date   CHOL 151 07/14/2021   HDL 44 07/14/2021   LDLCALC 87 07/14/2021   LDLDIRECT 119.2 02/20/2012   TRIG 103 07/14/2021   CHOLHDL 3.4 07/14/2021    Lab Results  Component Value Date   HGBA1C 5.6 07/15/2019      Assessment & Plan:   Mixed hyperlipidemia: Taking Crestor but has run out of Tricor due to insurance coverage issues.  Have discontinued Tricor 145 mg and sent in Tricor 160 mg to take daily.  Physical exam due in May.  Right-sided back pain: Occurs after playing golf. Tried OTC pain medication that has helped some. Would probably benefit from PT. PT ordered with [facility].   Possible Adult ADD: Referred to [facility] for evaluation.    I,Alexander Ruley,acting as a Education administrator for Elby Showers, MD.,have documented all relevant documentation on the behalf of Elby Showers, MD,as directed by  Elby Showers, MD while in the presence of Elby Showers, MD.   I, Elby Showers, MD, have reviewed all documentation for this visit. The documentation on 09/11/22 for the exam, diagnosis, procedures, and orders are all accurate and complete.

## 2022-09-27 ENCOUNTER — Telehealth: Payer: Self-pay | Admitting: Internal Medicine

## 2022-09-27 DIAGNOSIS — M5459 Other low back pain: Secondary | ICD-10-CM | POA: Diagnosis not present

## 2022-09-27 NOTE — Telephone Encounter (Signed)
This message was sent via [employer], a product from [employer] http://www.[employer].com/                    -------Fax Transmission Report-------  To:               Recipient at [phone] Subject:          Fw: Hp Scans Result:           The transmission was successful. Explanation:      All Pages Ok Pages Sent:       3 Connect Time:     2 minutes, 8 seconds Transmit Time:    09/27/2022 09:32 Transfer Rate:    9600 Status Code:      0000 Retry Count:      0 Job Id:           [id number] Id:        [id number] Fax Line:         42 Fax Server:       [employer]

## 2022-09-27 NOTE — Telephone Encounter (Signed)
Received PT orders to review, sign and fax back to [phone]

## 2022-10-12 DIAGNOSIS — M5459 Other low back pain: Secondary | ICD-10-CM | POA: Diagnosis not present

## 2022-10-14 ENCOUNTER — Other Ambulatory Visit: Payer: Self-pay | Admitting: Internal Medicine

## 2022-10-16 ENCOUNTER — Other Ambulatory Visit: Payer: Self-pay

## 2022-10-16 MED ORDER — CLONAZEPAM 0.5 MG PO TABS: 0.2500 mg | ORAL_TABLET | Freq: Two times a day (BID) | ORAL | 1 refills | Status: DC | PRN

## 2022-11-10 ENCOUNTER — Other Ambulatory Visit: Payer: BC Managed Care – PPO

## 2022-11-14 ENCOUNTER — Encounter: Payer: BC Managed Care – PPO | Admitting: Internal Medicine

## 2023-03-04 ENCOUNTER — Other Ambulatory Visit: Payer: Self-pay | Admitting: Internal Medicine

## 2023-04-02 ENCOUNTER — Ambulatory Visit: Payer: BC Managed Care – PPO | Admitting: Podiatry

## 2023-04-02 ENCOUNTER — Ambulatory Visit (INDEPENDENT_AMBULATORY_CARE_PROVIDER_SITE_OTHER): Payer: BC Managed Care – PPO

## 2023-04-02 DIAGNOSIS — S99929A Unspecified injury of unspecified foot, initial encounter: Secondary | ICD-10-CM

## 2023-04-02 DIAGNOSIS — S90851A Superficial foreign body, right foot, initial encounter: Secondary | ICD-10-CM | POA: Diagnosis not present

## 2023-04-02 NOTE — Progress Notes (Signed)
   Chief Complaint  Patient presents with   Foot Pain    Patient stepped into glass and he believes he has a small piece of it into the ball of his foot x 1 month.     HPI: 52 y.o. male presenting today as a new patient for evaluation of pain and tenderness associated to the plantar aspect of the second MTP of the right foot.  Onset about 1 month ago.  Patient believes he stepped on a piece of glass.  He says about 1 month ago he broke a glass bottle and the following day he started to notice some pain and tenderness associated to the plantar aspect of the right second toe with sensitivity.  Since that time he has been compensating and experiencing some tenderness now up to the great toe joint.  Presenting for further treatment evaluation  Past Medical History:  Diagnosis Date   Erythema multiforme 07/19/2018   FUO (fever of unknown origin) 07/19/2018   GERD (gastroesophageal reflux disease)    HTN (hypertension)    borderline- no meds   Hyperlipidemia     Past Surgical History:  Procedure Laterality Date   LASIK  07/11/2007   MOUTH SURGERY     growth in mouth that was biopsied   WISDOM TOOTH EXTRACTION      No Known Allergies   Physical Exam: General: The patient is alert and oriented x3 in no acute distress.  Dermatology: Skin is warm, dry and supple bilateral lower extremities.  There is some mild hyperkeratotic callus tissue to the plantar aspect of the second MTP right foot  Vascular: Palpable pedal pulses bilaterally. Capillary refill within normal limits.  No appreciable edema.  No erythema.  Neurological: Grossly intact via light touch  Musculoskeletal Exam: No pedal deformities noted  Radiographic Exam RT foot 04/02/2023:  Normal osseous mineralization. Joint spaces preserved.  No fractures or osseous irregularities noted.  No foreign body identified.  Impression: Negative  Assessment/Plan of Care: 1.  Possible foreign body glass plantar aspect of the right second  toe  -Patient evaluated.  X-rays reviewed -Light debridement of the callus area was performed today using a 312 scalpel without incident or bleeding.  No foreign body glass was identified -Advised against going barefoot around the house.  Patient does state that he wears socks or goes barefoot around the house.  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 Felecia Shelling, DPM    [address] [place], [place] [address]                Office [phone]  Fax [phone]

## 2023-04-13 ENCOUNTER — Other Ambulatory Visit: Payer: BC Managed Care – PPO

## 2023-04-20 ENCOUNTER — Encounter: Payer: BC Managed Care – PPO | Admitting: Internal Medicine

## 2023-05-01 ENCOUNTER — Other Ambulatory Visit: Payer: BC Managed Care – PPO

## 2023-05-01 DIAGNOSIS — Z125 Encounter for screening for malignant neoplasm of prostate: Secondary | ICD-10-CM

## 2023-05-01 DIAGNOSIS — E782 Mixed hyperlipidemia: Secondary | ICD-10-CM

## 2023-05-01 DIAGNOSIS — R7309 Other abnormal glucose: Secondary | ICD-10-CM

## 2023-05-01 DIAGNOSIS — Z0000 Encounter for general adult medical examination without abnormal findings: Secondary | ICD-10-CM

## 2023-05-01 NOTE — Progress Notes (Signed)
Patient Care Team: Margaree Mackintosh, MD as PCP - General (Internal Medicine)  Visit Date: 05/08/23  Subjective:    Patient ID: Daniel Weaver , Male   DOB: 06-17-1971, 52 y.o.    MRN: [id number]   52 y.o. Male presents today for an annual comprehensive physical exam. History of erythema multiforme, GERD, hypertension, hyperlipidemia.   History of hyperlipidemia treated with rosuvastatin 40 mg daily, fenofibrate 160 mg daily. Lipid panel normal.   HGBA1c at 6.2% on 05/01/23, up from 5.6% three years ago. He is agreeable to starting low-dose medication. He has been eating lots of sweets lately.  He is playing golf twice weekly and hiking.   History of anxiety treated with trazodone 100 mg at bedtime, clonazepam 0.25 - 0.5 mg twice daily as needed. Reports he is sleeping well with 50 mg of trazodone.  History of GERD treated with omeprazole 10 mg daily.  He has had 2 admissions for alcoholism. One was in [place] and the other one was at [facility] in [place]. He is doing well in recovery at the present time.   In 2004, it was noted he had smoked for 12 years. Subsequently he quit smoking.   Reports he was sick 3 months ago when others in his household tested positive for Covid-19.  Labs reviewed today. Glucose elevated at 106. Kidney, liver functions normal. Electrolytes normal. Blood proteins normal. CBC normal. PSA at 0.49.  Colonoscopy last completed 09/27/21. Showed single erosion in terminal ileum, diverticulosis in sigmoid colon, non-bleeding external and internal hemorrhoids.  Social history: He is married.  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  Family history: Father with history of heart disease and died of a heart problem.  Mother with history of stroke, history of hypertension as well as hyperlipidemia.   Past Medical History:  Diagnosis Date   Erythema multiforme  07/19/2018   FUO (fever of unknown origin) 07/19/2018   GERD (gastroesophageal reflux disease)    HTN (hypertension)    borderline- no meds   Hyperlipidemia      Family History  Problem Relation Age of Onset   Heart attack Father 5   Heart attack Maternal Grandfather 63   Colon cancer Neg Hx    Colon polyps Neg Hx    Esophageal cancer Neg Hx    Rectal cancer Neg Hx    Stomach cancer Neg Hx     Social History   Social History Narrative   Social history: He is married.  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       Family history: Father with history of heart disease and died of a heart problem.  Mother with history of stroke and history of hypertension as well as hyperlipidemia.      Review of Systems  Constitutional:  Negative for chills, fever, malaise/fatigue and weight loss.  HENT:  Negative for hearing loss, sinus pain and sore throat.   Respiratory:  Negative for cough, hemoptysis and shortness of breath.   Cardiovascular:  Negative for chest pain, palpitations, leg swelling and PND.  Gastrointestinal:  Negative for abdominal pain, constipation, diarrhea, heartburn, nausea and vomiting.  Genitourinary:  Negative for dysuria, frequency and urgency.  Musculoskeletal:  Negative for back pain, myalgias and neck pain.  Skin:  Negative for itching and rash.  Neurological:  Negative for dizziness, tingling, seizures and headaches.  Endo/Heme/Allergies:  Negative for polydipsia.  Psychiatric/Behavioral:  Negative for depression. The patient is not nervous/anxious.         Objective:   Vitals: BP 130/80   Pulse 90   Ht 5\' 11"  (1.803 m)   Wt 202 lb (91.6 kg)   SpO2 97%   BMI 28.17 kg/m    Physical Exam Vitals and nursing note reviewed.  Constitutional:      General: He is awake. He is not in acute distress.    Appearance: Normal appearance. He is not ill-appearing or  toxic-appearing.  HENT:     Head: Normocephalic and atraumatic.     Right Ear: Hearing, tympanic membrane, ear canal and external ear normal.     Left Ear: Hearing, tympanic membrane, ear canal and external ear normal.     Mouth/Throat:     Pharynx: Oropharynx is clear.  Eyes:     Extraocular Movements: Extraocular movements intact.     Pupils: Pupils are equal, round, and reactive to light.  Neck:     Thyroid: No thyroid mass, thyromegaly or thyroid tenderness.     Vascular: No carotid bruit.  Cardiovascular:     Rate and Rhythm: Normal rate and regular rhythm. No extrasystoles are present.    Heart sounds: Normal heart sounds. No murmur heard.    No friction rub. No gallop.  Pulmonary:     Effort: Pulmonary effort is normal.     Breath sounds: Normal breath sounds. No decreased breath sounds, wheezing, rhonchi or rales.  Chest:     Chest wall: No mass.  Abdominal:     Palpations: Abdomen is soft.     Tenderness: There is no abdominal tenderness.     Hernia: No hernia is present.  Genitourinary:    Prostate: Normal. Not enlarged and no nodules present.     Comments: Prostate smooth and symmetrical. Musculoskeletal:     Cervical back: Normal range of motion.     Right lower leg: No edema.     Left lower leg: No edema.  Lymphadenopathy:     Cervical: No cervical adenopathy.     Upper Body:     Right upper body: No supraclavicular adenopathy.     Left upper body: No supraclavicular adenopathy.  Skin:    General: Skin is warm and dry.  Neurological:     General: No focal deficit present.     Mental Status: He is alert and oriented to person, place, and time. Mental status is at baseline.     Cranial Nerves: Cranial nerves 2-12 are intact.     Sensory: Sensation is intact.     Motor: Motor function is intact.     Coordination: Coordination is intact.     Gait: Gait is intact.     Deep Tendon Reflexes: Reflexes are normal and symmetric.  Psychiatric:        Attention and  Perception: Attention normal.        Mood and Affect: Mood normal.        Speech: Speech normal.        Behavior: Behavior normal. Behavior is cooperative.        Thought Content: Thought content normal.        Cognition and Memory: Cognition and memory normal.        Judgment: Judgment normal.       Results:   Studies obtained and personally reviewed by me:  Colonoscopy last completed 09/27/21. Showed single erosion in terminal ileum, diverticulosis  in sigmoid colon, non-bleeding external and internal hemorrhoids.  Labs:       Component Value Date/Time   NA 140 05/01/2023 0921   K 4.7 05/01/2023 0921   CL 104 05/01/2023 0921   CO2 26 05/01/2023 0921   GLUCOSE 106 (H) 05/01/2023 0921   BUN 11 05/01/2023 0921   CREATININE 1.08 05/01/2023 0921   CALCIUM 10.1 05/01/2023 0921   PROT 7.3 05/01/2023 0921   ALBUMIN 4.4 01/25/2015 0940   AST 16 05/01/2023 0921   ALT 18 05/01/2023 0921   ALKPHOS 52 01/25/2015 0940   BILITOT 0.4 05/01/2023 0921   GFRNONAA 75 10/20/2019 1004   GFRAA 87 10/20/2019 1004     Lab Results  Component Value Date   WBC 7.2 05/01/2023   HGB 15.4 05/01/2023   HCT 46.3 05/01/2023   MCV 89.7 05/01/2023   PLT 231 05/01/2023    Lab Results  Component Value Date   CHOL 147 05/01/2023   HDL 47 05/01/2023   LDLCALC 83 05/01/2023   LDLDIRECT 119.2 02/20/2012   TRIG 80 05/01/2023   CHOLHDL 3.1 05/01/2023    Lab Results  Component Value Date   HGBA1C 6.2 (H) 05/01/2023     No results found for: "TSH"   Lab Results  Component Value Date   PSA 0.49 05/01/2023      Assessment & Plan:   Hyperlipidemia: treated with rosuvastatin 40 mg daily, fenofibrate 160 mg daily. Lipid panel normal.   Impaired glucose tolerance: start metformin 500 mg daily with meal. HGBA1c at 6.2% on 05/01/23, up from 5.6% three years ago.   Anxiety: treated with trazodone 50 mg at bedtime, clonazepam 0.25 - 0.5 mg twice daily as needed.  GERD: stable with omeprazole 10  mg daily.  Urinalysis normal today.  Stool guaiac negative.  Colonoscopy last completed 09/27/21. Showed single erosion in terminal ileum, diverticulosis in sigmoid colon, non-bleeding external and internal hemorrhoids.  Vaccine counseling: administered flu booster. UTD on tetanus booster.  Return in spring 2025 for A1c recheck.    I,Alexander Ruley,acting as a Neurosurgeon for Margaree Mackintosh, MD.,have documented all relevant documentation on the behalf of Margaree Mackintosh, MD,as directed by  Margaree Mackintosh, MD while in the presence of Margaree Mackintosh, MD.   ***

## 2023-05-03 NOTE — Addendum Note (Signed)
Addended by: Gregery Na on: 05/03/2023 12:37 PM   Modules accepted: Orders

## 2023-05-04 LAB — TEST AUTHORIZATION

## 2023-05-04 LAB — COMPLETE METABOLIC PANEL WITH GFR
AG Ratio: 1.8 (calc) (ref 1.0–2.5)
ALT: 18 U/L (ref 9–46)
AST: 16 U/L (ref 10–35)
Albumin: 4.7 g/dL (ref 3.6–5.1)
Alkaline phosphatase (APISO): 62 U/L (ref 35–144)
BUN: 11 mg/dL (ref 7–25)
CO2: 26 mmol/L (ref 20–32)
Calcium: 10.1 mg/dL (ref 8.6–10.3)
Chloride: 104 mmol/L (ref 98–110)
Creat: 1.08 mg/dL (ref 0.70–1.30)
Globulin: 2.6 g/dL (ref 1.9–3.7)
Glucose, Bld: 106 mg/dL — ABNORMAL HIGH (ref 65–99)
Potassium: 4.7 mmol/L (ref 3.5–5.3)
Sodium: 140 mmol/L (ref 135–146)
Total Bilirubin: 0.4 mg/dL (ref 0.2–1.2)
Total Protein: 7.3 g/dL (ref 6.1–8.1)
eGFR: 83 mL/min/{1.73_m2} (ref >=60)

## 2023-05-04 LAB — CBC WITH DIFFERENTIAL/PLATELET
Absolute Lymphocytes: 2124 {cells}/uL (ref 850–3900)
Absolute Monocytes: 367 {cells}/uL (ref 200–950)
Basophils Absolute: 43 {cells}/uL (ref 0–200)
Basophils Relative: 0.6 %
Eosinophils Absolute: 288 {cells}/uL (ref 15–500)
Eosinophils Relative: 4 %
HCT: 46.3 % (ref 38.5–50.0)
Hemoglobin: 15.4 g/dL (ref 13.2–17.1)
MCH: 29.8 pg (ref 27.0–33.0)
MCHC: 33.3 g/dL (ref 32.0–36.0)
MCV: 89.7 fL (ref 80.0–100.0)
MPV: 10 fL (ref 7.5–12.5)
Monocytes Relative: 5.1 %
Neutro Abs: 4378 {cells}/uL (ref 1500–7800)
Neutrophils Relative %: 60.8 %
Platelets: 231 10*3/uL (ref 140–400)
RBC: 5.16 10*6/uL (ref 4.20–5.80)
RDW: 12.2 % (ref 11.0–15.0)
Total Lymphocyte: 29.5 %
WBC: 7.2 10*3/uL (ref 3.8–10.8)

## 2023-05-04 LAB — PSA: PSA: 0.49 ng/mL (ref <=4.00)

## 2023-05-04 LAB — HEMOGLOBIN A1C W/OUT EAG: Hgb A1c MFr Bld: 6.2 %{Hb} — ABNORMAL HIGH (ref <5.7)

## 2023-05-04 LAB — LIPID PANEL
Cholesterol: 147 mg/dL (ref <200)
HDL: 47 mg/dL (ref >=40)
LDL Cholesterol (Calc): 83 mg/dL
Non-HDL Cholesterol (Calc): 100 mg/dL (ref <130)
Total CHOL/HDL Ratio: 3.1 (calc) (ref <5.0)
Triglycerides: 80 mg/dL (ref <150)

## 2023-05-08 ENCOUNTER — Ambulatory Visit: Payer: BC Managed Care – PPO | Admitting: Internal Medicine

## 2023-05-08 ENCOUNTER — Encounter: Payer: Self-pay | Admitting: Internal Medicine

## 2023-05-08 VITALS — BP 130/80 | HR 90 | Ht 71.0 in | Wt 202.0 lb

## 2023-05-08 DIAGNOSIS — F1021 Alcohol dependence, in remission: Secondary | ICD-10-CM

## 2023-05-08 DIAGNOSIS — E782 Mixed hyperlipidemia: Secondary | ICD-10-CM

## 2023-05-08 DIAGNOSIS — Z23 Encounter for immunization: Secondary | ICD-10-CM

## 2023-05-08 DIAGNOSIS — Z1211 Encounter for screening for malignant neoplasm of colon: Secondary | ICD-10-CM | POA: Diagnosis not present

## 2023-05-08 DIAGNOSIS — R7302 Impaired glucose tolerance (oral): Secondary | ICD-10-CM | POA: Diagnosis not present

## 2023-05-08 DIAGNOSIS — Z8659 Personal history of other mental and behavioral disorders: Secondary | ICD-10-CM

## 2023-05-08 DIAGNOSIS — Z0000 Encounter for general adult medical examination without abnormal findings: Secondary | ICD-10-CM

## 2023-05-08 LAB — POCT URINALYSIS DIP (CLINITEK)
Bilirubin, UA: NEGATIVE
Blood, UA: NEGATIVE
Glucose, UA: NEGATIVE mg/dL
Ketones, POC UA: NEGATIVE mg/dL
Leukocytes, UA: NEGATIVE
Nitrite, UA: NEGATIVE
POC PROTEIN,UA: NEGATIVE
Spec Grav, UA: 1.01 (ref 1.010–1.025)
Urobilinogen, UA: 0.2 U/dL
pH, UA: 7.5 (ref 5.0–8.0)

## 2023-05-08 LAB — IFOBT (OCCULT BLOOD): IFOBT: NEGATIVE

## 2023-05-08 MED ORDER — METFORMIN HCL 500 MG PO TABS: 500.0000 mg | ORAL_TABLET | ORAL | 0 refills | Status: DC

## 2023-05-10 NOTE — Patient Instructions (Addendum)
Begin metformin for impaired glucose tolerance. Continue medications as prescribed for hyperlipidemia. Continue omeprazole for GE reflux. Anxiety is under good control with current medications. Return in Spring 2025 for follow up on glucose intolerance with Hgb AIC being drawn with office visit at that time. It was a pleasure to see you today. Flu vaccine given.

## 2023-05-16 ENCOUNTER — Other Ambulatory Visit: Payer: Self-pay | Admitting: Family

## 2023-05-23 NOTE — Telephone Encounter (Unsigned)
Copied from CRM [id number]. Topic: Clinical - Medication Refill >> May 23, 2023  9:29 AM Dollene Primrose wrote: Most Recent Primary Care Visit:  Provider: Margaree Mackintosh  Department: [facility]  Visit Type: PHYSICAL 45  Date: 05/08/2023  Medication:  1-clonazePAM (KLONOPIN) 0.5 MG tablet   Has the patient contacted their pharmacy? Yes-2nd attempt for refill from [facility](PA needed? Wrong location?) (Agent: If no, request that the patient contact the pharmacy for the refill. If patient does not wish to contact the pharmacy document the reason why and proceed with request.) (Agent: If yes, when and what did the pharmacy advise?)  Is this the correct pharmacy for this prescription? Yes If no, delete pharmacy and type the correct one.    [id number] [place], [place] - [address] AT [facility] OF [address] [place] [address] Phone: [phone] Fax: [phone]   Has the prescription been filled recently? yes  Is the patient out of the medication? yes  Has the patient been seen for an appointment in the last year OR does the patient have an upcoming appointment? yes  Can we respond through MyChart? yes  Agent: Please be advised that Rx refills may take up to 3 business days. We ask that you follow-up with your pharmacy.

## 2023-07-03 ENCOUNTER — Other Ambulatory Visit: Payer: Self-pay | Admitting: Internal Medicine

## 2023-07-19 ENCOUNTER — Other Ambulatory Visit: Payer: Self-pay

## 2023-07-19 MED ORDER — CLONAZEPAM 0.5 MG PO TABS: ORAL_TABLET | ORAL | 1 refills | Status: DC

## 2023-07-19 NOTE — Telephone Encounter (Signed)
 Copied from CRM [id number]. Topic: Clinical - Medication Refill >> Jul 19, 2023  9:10 AM Zayanah H wrote: Most Recent Primary Care Visit:  Provider: PERRI RONAL PARAS  Department: [facility]  Visit Type: PHYSICAL 45  Date: 05/08/2023  Medication: clonazePAM  (KLONOPIN ) 0.5 MG tablet   Has the patient contacted their pharmacy? No (Agent: If no, request that the patient contact the pharmacy for the refill. If patient does not wish to contact the pharmacy document the reason why and proceed with request.) (Agent: If yes, when and what did the pharmacy advise?)  Is this the correct pharmacy for this prescription? Yes If no, delete pharmacy and type the correct one.  [id number] [place], [place] - [address] AT [facility] OF [address] [place] [address] Phone: [phone] Fax: [phone]   Has the prescription been filled recently? No  Is the patient out of the medication? No  Has the patient been seen for an appointment in the last year OR does the patient have an upcoming appointment? No  Can we respond through MyChart? No  Agent: Please be advised that Rx refills may take up to 3 business days. We ask that you follow-up with your pharmacy.

## 2023-08-05 ENCOUNTER — Other Ambulatory Visit: Payer: Self-pay | Admitting: Internal Medicine

## 2023-09-10 ENCOUNTER — Other Ambulatory Visit: Payer: BC Managed Care – PPO

## 2023-09-11 ENCOUNTER — Encounter: Payer: BC Managed Care – PPO | Admitting: Internal Medicine

## 2023-10-01 ENCOUNTER — Other Ambulatory Visit: Payer: Self-pay | Admitting: Internal Medicine

## 2023-11-07 NOTE — Progress Notes (Signed)
 Patient Care Team: Sylvan Evener, MD as PCP - General (Internal Medicine)  Visit Date: 11/13/23  Subjective:   Chief Complaint  Patient presents with   Medical Management of Chronic Issues   Hyperlipidemia   Patient Daniel Weaver, Daniel Weaver DOB:03-23-1971,53 y.o. OZH:[id number]   53 y.o. Male presents today for 6 months follow-up for Mixed Hyperlipidemia dx in 2021 and mild Impaired Glucose Tolerance.   History of Mixed Hyperlipidemia treated with  Rosuvastatin  40 mg daily and Fenofibrate  160 mg daily. 11/12/2023 Lipid Panel: WNL.  Reviewed 11/12/2023 Hepatic Function Panel: results are normal.  History of Impaired Glucose Tolerance treated with Metformin  500 mg daily.  05/01/2023 HgbA1c 6.2%, Glucose 106. Have asked patient to come back and repeat this at his convenience as we overlooked it with recent lab draw.  History of hypertension with Blood Pressure: normotensive today at 130/80.   History of Anxiety treated with Clonazepam  0.25mg  - 0.5 mg twice daily as needed and 50 mg Trazodone  at bedtime. Says he has been dealing with some stress recently at work, but he is handling it well. Mentions that he would like to try Strattera for attention deficit issues. Starting Straterra 40 mg daily see how this works for him over 4-6 weeks. Dose can be increased to 80 mg daily.  Vaccine Counseling: Flu vaccine due Fall 2025. Pneumococcal 20 vaccinewill be discussed at his annual visit.  Past Medical History:  Diagnosis Date   Erythema multiforme 07/19/2018   FUO (fever of unknown origin) 07/19/2018   GERD (gastroesophageal reflux disease)    HTN (hypertension)    borderline- no meds   Hyperlipidemia   No Known Allergies  Family History  Problem Relation Age of Onset   Heart attack Father 58   Heart attack Maternal Grandfather 7   Colon cancer Neg Hx    Colon polyps Neg Hx    Esophageal cancer Neg Hx    Rectal cancer Neg Hx    Stomach cancer Neg Hx    Social Hx: Married.  Graduate of [employer]. 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  Family Hx: Father with Hx of heart disease died with a heart problem. Mother with hx of stroke, HTN, and hyperlipidemia.  Review of Systems  Constitutional:  Negative for fever and malaise/fatigue.  HENT:  Negative for congestion.   Eyes:  Negative for blurred vision.  Respiratory:  Negative for cough and shortness of breath.   Cardiovascular:  Negative for chest pain, palpitations and leg swelling.  Gastrointestinal:  Negative for vomiting.  Musculoskeletal:  Negative for back pain.  Skin:  Negative for rash.  Neurological:  Negative for loss of consciousness and headaches.     Objective:  Vitals: BP 130/80   Pulse 88   Ht 5\' 11"  (1.803 m)   Wt 194 lb (88 kg)   SpO2 97%   BMI 27.06 kg/m   Physical Exam Vitals and nursing note reviewed.  Constitutional:      General: He is not in acute distress.    Appearance: Normal appearance. He is not ill-appearing.  HENT:     Head: Normocephalic and atraumatic.  Pulmonary:     Effort: Pulmonary effort is normal.  Skin:    General: Skin is warm and dry.  Neurological:     Mental Status: He is alert and oriented to person, place, and time. Mental status is at baseline.  Psychiatric:        Mood and Affect: Mood normal.  Behavior: Behavior normal.        Thought Content: Thought content normal.        Judgment: Judgment normal.     Results:  Studies Obtained And Personally Reviewed By Me: Labs:     Component Value Date/Time   NA 140 05/01/2023 0921   K 4.7 05/01/2023 0921   CL 104 05/01/2023 0921   CO2 26 05/01/2023 0921   GLUCOSE 106 (H) 05/01/2023 0921   BUN 11 05/01/2023 0921   CREATININE 1.08 05/01/2023 0921   CALCIUM  10.1 05/01/2023 0921   PROT 7.3 11/12/2023 0939   ALBUMIN 4.4 01/25/2015 0940   AST 17 11/12/2023 0939   ALT 14 11/12/2023 0939   ALKPHOS 52 01/25/2015 0940   BILITOT 0.5 11/12/2023  0939   GFRNONAA 75 10/20/2019 1004   GFRAA 87 10/20/2019 1004    Lab Results  Component Value Date   WBC 7.2 05/01/2023   HGB 15.4 05/01/2023   HCT 46.3 05/01/2023   MCV 89.7 05/01/2023   PLT 231 05/01/2023   Lab Results  Component Value Date   CHOL 148 11/12/2023   HDL 44 11/12/2023   LDLCALC 87 11/12/2023   LDLDIRECT 119.2 02/20/2012   TRIG 76 11/12/2023   CHOLHDL 3.4 11/12/2023   Lab Results  Component Value Date   HGBA1C 6.2 (H) 05/01/2023    Lab Results  Component Value Date   PSA 0.49 05/01/2023     Assessment & Plan:   Meds ordered this encounter  Medications   atomoxetine (STRATTERA) 40 MG capsule    Sig: Take 1 capsule (40 mg total) by mouth daily.    Dispense:  30 capsule    Refill:  2  Mixed Hyperlipidemia treated with  Rosuvastatin  40 mg daily and Fenofibrate  160 mg daily. 11/12/2023 Lipid Panel: WNL.  Reviewed 11/12/2023 Hepatic Function Panel: WNL.  Impaired Glucose Tolerance treated with Metformin  500 mg daily.  05/01/2023 HgbA1c 6.2, Glucose 106. Needs Hgb AIC- will ask him to return for this. Does not have to fast.  Attention deficit issues. Patient would like to try Strattera. Can start with 40 mg daily and can increase to 80 mg daily in a few weeks if no significant improvement.   Anxiety treated with Clonazepam  0.25-0.5 mg twice daily as needed and 50 mg Trazodone  at bedtime. Says he has been dealing with some stress recently at work, but he is handling it well.     Vaccine Counseling: Covid-19 postponed until October & PNA will be discussed at his annual visit.   CPE due in November    I,Emily Lagle,acting as a Neurosurgeon for Sylvan Evener, MD.,have documented all relevant documentation on the behalf of Sylvan Evener, MD,as directed by  Sylvan Evener, MD while in the presence of Sylvan Evener, MD.   I, Sylvan Evener, MD, have reviewed all documentation for this visit. The documentation on 11/13/23 for the exam, diagnosis, procedures, and orders  are all accurate and complete.

## 2023-11-12 ENCOUNTER — Other Ambulatory Visit: Payer: BC Managed Care – PPO

## 2023-11-12 DIAGNOSIS — E782 Mixed hyperlipidemia: Secondary | ICD-10-CM

## 2023-11-12 DIAGNOSIS — R7302 Impaired glucose tolerance (oral): Secondary | ICD-10-CM | POA: Diagnosis not present

## 2023-11-13 ENCOUNTER — Ambulatory Visit: Payer: BC Managed Care – PPO | Admitting: Internal Medicine

## 2023-11-13 ENCOUNTER — Other Ambulatory Visit: Payer: Self-pay | Admitting: Internal Medicine

## 2023-11-13 ENCOUNTER — Encounter: Payer: Self-pay | Admitting: Internal Medicine

## 2023-11-13 VITALS — BP 130/80 | HR 88 | Ht 71.0 in | Wt 194.0 lb

## 2023-11-13 DIAGNOSIS — R7302 Impaired glucose tolerance (oral): Secondary | ICD-10-CM | POA: Diagnosis not present

## 2023-11-13 DIAGNOSIS — Z8659 Personal history of other mental and behavioral disorders: Secondary | ICD-10-CM

## 2023-11-13 DIAGNOSIS — F988 Other specified behavioral and emotional disorders with onset usually occurring in childhood and adolescence: Secondary | ICD-10-CM

## 2023-11-13 DIAGNOSIS — E782 Mixed hyperlipidemia: Secondary | ICD-10-CM

## 2023-11-13 LAB — LIPID PANEL
Cholesterol: 148 mg/dL (ref <200)
HDL: 44 mg/dL (ref >=40)
LDL Cholesterol (Calc): 87 mg/dL
Non-HDL Cholesterol (Calc): 104 mg/dL (ref <130)
Total CHOL/HDL Ratio: 3.4 (calc) (ref <5.0)
Triglycerides: 76 mg/dL (ref <150)

## 2023-11-13 LAB — HEPATIC FUNCTION PANEL
AG Ratio: 1.9 (calc) (ref 1.0–2.5)
ALT: 14 U/L (ref 9–46)
AST: 17 U/L (ref 10–35)
Albumin: 4.8 g/dL (ref 3.6–5.1)
Alkaline phosphatase (APISO): 53 U/L (ref 35–144)
Bilirubin, Direct: 0.1 mg/dL (ref 0.0–0.2)
Globulin: 2.5 g/dL (ref 1.9–3.7)
Indirect Bilirubin: 0.4 mg/dL (ref 0.2–1.2)
Total Bilirubin: 0.5 mg/dL (ref 0.2–1.2)
Total Protein: 7.3 g/dL (ref 6.1–8.1)

## 2023-11-13 MED ORDER — ATOMOXETINE HCL 40 MG PO CAPS: 40.0000 mg | ORAL_CAPSULE | Freq: Every day | ORAL | 2 refills | Status: AC

## 2023-11-13 NOTE — Patient Instructions (Addendum)
 We realized after he left the office his Hgb AIC had not been checked since October 2024 and have asked him to return for this. He will return in November for fasting CPE labs including Hgb AIC

## 2023-11-15 ENCOUNTER — Other Ambulatory Visit

## 2023-11-15 DIAGNOSIS — R7302 Impaired glucose tolerance (oral): Secondary | ICD-10-CM

## 2023-11-16 LAB — HEMOGLOBIN A1C
Hgb A1c MFr Bld: 6.1 % — ABNORMAL HIGH (ref <5.7)
Mean Plasma Glucose: 128 mg/dL
eAG (mmol/L): 7.1 mmol/L

## 2024-02-26 ENCOUNTER — Other Ambulatory Visit: Payer: Self-pay

## 2024-02-26 MED ORDER — METFORMIN HCL 500 MG PO TABS: 500.0000 mg | ORAL_TABLET | Freq: Every day | ORAL | 3 refills | Status: DC

## 2024-03-22 ENCOUNTER — Other Ambulatory Visit: Payer: Self-pay | Admitting: Internal Medicine

## 2024-03-26 DIAGNOSIS — L723 Sebaceous cyst: Secondary | ICD-10-CM | POA: Diagnosis not present

## 2024-03-31 DIAGNOSIS — L720 Epidermal cyst: Secondary | ICD-10-CM | POA: Diagnosis not present

## 2024-04-14 ENCOUNTER — Other Ambulatory Visit: Payer: Self-pay | Admitting: Internal Medicine

## 2024-05-19 NOTE — Progress Notes (Signed)
 Annual Comprehensive Physical Exam    Patient Care Team: Perri Ronal PARAS, MD as PCP - General (Internal Medicine)  Visit Date: 05/22/24   Chief Complaint  Patient presents with   Annual Exam   Subjective:  Patient: Daniel Weaver, Male DOB: 06/21/1971, 53 y.o. MRN: [id number] Vitals:   05/22/24 1446  BP: 120/88   Daniel Weaver is a 52 y.o. Male who presents today for his Annual Comprehensive Physical Exam.  He says that he has a sore throat in morning when he wakes up. He has been experiencing this for about a week. Will call office if symptoms persist.    History of Hyperlipidemia treated with Rosuvastatin  40 mg daily, Fenofibrate  160 mg daily. 05/20/2024 Lipid panel Triglycerides 151, Otherwise WNL.   History of impaired glucose tolerance treated with Metformin  500 mg daily. 05/20/2024 HgbA1c 6.1%.   History of Anxiety treated with Trazadone 100 mg as bedtime, clonazepam  0.25-0.5 mg twice daily as needed.   History of GE Reflux treated with Omeprozole 10 mg daily.    He golfs frequently and says that his back gets very sore after a round of golf.   He has had 2 admissions for alcoholism. One was in [place]  and the other one was at [facility] in [place]. He is doing well in recovery.   In 2004, it was noted he had smoked for 12 years. Subsequently, he quit smoking.   Labs 05/20/2024 HgbA1c 6.1%, WBC 11.9, Absolute Neutrophils 9,068, Blood glucose 104,  Triglycerides 151, Otherwise WNL.   09/27/2021 Colonoscopy A single erosion in the terminal ileum. Diverticulosis in the sigmoid colon. Non- bleeding external and internal hemorrhoids. The examination was otherwise normal. Repeat in 10 years.   Vaccine counseling: Influenza vaccine received today.  Pneumonia vaccine due. Covid-19 vaccine today.    Health Maintenance  Topic Date Due   Pneumococcal Vaccine: 50+ Years (1 of 2 - PCV) Never done   COVID-19 Vaccine (4 - 2025-26 season) 03/10/2024   Hepatitis B  Vaccines 19-59 Average Risk (1 of 3 - 19+ 3-dose series) 05/22/2025 (Originally 01/28/1991)   DTaP/Tdap/Td (9 - Td or Tdap) 06/22/2027   Colonoscopy  09/28/2031   Influenza Vaccine  Completed   HIV Screening  Completed   HPV VACCINES  Aged Out   Meningococcal B Vaccine  Aged Out   Hepatitis C Screening  Discontinued   Zoster Vaccines- Shingrix  Discontinued     Review of Systems  Constitutional:  Negative for fever and malaise/fatigue.  HENT:  Negative for congestion.   Eyes:  Negative for blurred vision.  Respiratory:  Negative for cough and shortness of breath.   Cardiovascular:  Negative for chest pain, palpitations and leg swelling.  Gastrointestinal:  Negative for vomiting.  Musculoskeletal:  Negative for back pain.  Skin:  Negative for rash.  Neurological:  Negative for loss of consciousness and headaches.   Objective:  Vitals: body mass index is 27.62 kg/m. Today's Vitals   05/22/24 1446  BP: 120/88  Pulse: (!) 112  SpO2: 97%  Weight: 198 lb (89.8 kg)  Height: 5' 11 (1.803 m)  PainSc: 0-No pain   Physical Exam Vitals and nursing note reviewed. Exam conducted with a chaperone present (Araceli [place], CMA).  Constitutional:      General: He is awake. He is not in acute distress.    Appearance: Normal appearance. He is not ill-appearing or toxic-appearing.  HENT:     Head: Normocephalic and atraumatic.     Right Ear:  Tympanic membrane, ear canal and external ear normal.     Left Ear: Tympanic membrane, ear canal and external ear normal.     Mouth/Throat:     Pharynx: Oropharynx is clear.  Eyes:     Extraocular Movements: Extraocular movements intact.     Pupils: Pupils are equal, round, and reactive to light.  Neck:     Thyroid: No thyroid mass, thyromegaly or thyroid tenderness.     Vascular: No carotid bruit.  Cardiovascular:     Rate and Rhythm: Normal rate and regular rhythm. No extrasystoles are present.    Pulses:          Dorsalis pedis pulses are  2+ on the right side and 2+ on the left side.       Posterior tibial pulses are 2+ on the right side and 2+ on the left side.     Heart sounds: Normal heart sounds. No murmur heard.    No friction rub. No gallop.  Pulmonary:     Effort: Pulmonary effort is normal.     Breath sounds: Normal breath sounds. No decreased breath sounds, wheezing, rhonchi or rales.  Chest:     Chest wall: No mass.  Abdominal:     Palpations: Abdomen is soft. There is no hepatomegaly, splenomegaly or mass.     Tenderness: There is no abdominal tenderness.     Hernia: No hernia is present.  Genitourinary:    Prostate: Normal. Not enlarged, not tender and no nodules present.     Rectum: Normal. Guaiac result negative.  Musculoskeletal:     Cervical back: Normal range of motion.     Right lower leg: No edema.     Left lower leg: No edema.  Lymphadenopathy:     Cervical: No cervical adenopathy.     Upper Body:     Right upper body: No supraclavicular adenopathy.     Left upper body: No supraclavicular adenopathy.  Skin:    General: Skin is warm and dry.  Neurological:     General: No focal deficit present.     Mental Status: He is alert and oriented to person, place, and time. Mental status is at baseline.     Cranial Nerves: Cranial nerves 2-12 are intact.     Sensory: Sensation is intact.     Motor: Motor function is intact.     Coordination: Coordination is intact.     Gait: Gait is intact.     Deep Tendon Reflexes: Reflexes are normal and symmetric.  Psychiatric:        Attention and Perception: Attention normal.        Mood and Affect: Mood normal.        Speech: Speech normal.        Behavior: Behavior normal. Behavior is cooperative.        Thought Content: Thought content normal.        Cognition and Memory: Cognition and memory normal.        Judgment: Judgment normal.     Current Outpatient Medications  Medication Instructions   atomoxetine  (STRATTERA ) 40 mg, Oral, Daily   clonazePAM   (KLONOPIN ) 0.5 MG tablet TAKE 1/2 TO 1 TABLET(0.25 TO 0.5 MG) BY MOUTH TWICE DAILY AS NEEDED FOR ANXIETY   fenofibrate  160 mg, Oral, Daily   meloxicam  (MOBIC ) 15 MG tablet One tablet by mouth daily as needed for musculoskeletal pain   metFORMIN  (GLUCOPHAGE ) 500 mg, Oral, 2 times daily with meals   Multiple Vitamin (MULTIVITAMIN)  tablet 1 tablet, Daily   omeprazole (PRILOSEC) 10 mg, Daily   rosuvastatin  (CRESTOR ) 40 MG tablet TAKE 1 TABLET(40 MG) BY MOUTH DAILY   Past Medical History:  Diagnosis Date   Anxiety    Erythema multiforme 07/19/2018   FUO (fever of unknown origin) 07/19/2018   GERD (gastroesophageal reflux disease)    HTN (hypertension)    borderline- no meds   Hyperlipidemia    Medical/Surgical History Narrative:  Allergic/Intolerant to: No Known Allergies  Past Surgical History:  Procedure Laterality Date   EYE SURGERY     LASIK  07/11/2007   MOUTH SURGERY     growth in mouth that was biopsied   WISDOM TOOTH EXTRACTION     Family History  Problem Relation Age of Onset   Heart attack Father 69   Alcohol abuse Father    Heart attack Maternal Grandfather 66   ADD / ADHD Son    Colon cancer Neg Hx    Colon polyps Neg Hx    Esophageal cancer Neg Hx    Rectal cancer Neg Hx    Stomach cancer Neg Hx    Family History Narrative: Father with history of heart disease and died of a heart problem. Mother with history of stroke, history of hypertension as well as hyperlipidemia.  Social History   Social History Narrative   Social history: He is married.  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       Family history: Father with history of heart disease and died of a heart problem.  Mother with history of stroke and history of hypertension as well as hyperlipidemia.   Most Recent Health Risks Assessment:   Most Recent Social Determinants of Health (Including Hx of Tobacco, Alcohol, and Drug  Use) SDOH Screenings   Food Insecurity: No Food Insecurity (05/19/2024)  Housing: Unknown (05/19/2024)  Transportation Needs: No Transportation Needs (05/19/2024)  Depression (PHQ2-9): Low Risk  (11/13/2023)  Financial Resource Strain: Low Risk  (05/19/2024)  Physical Activity: Sufficiently Active (05/19/2024)  Social Connections: Moderately Integrated (05/19/2024)  Stress: No Stress Concern Present (05/19/2024)  Tobacco Use: High Risk (05/22/2024)   Social History   Tobacco Use   Smoking status: Some Days    Current packs/day: 0.00    Average packs/day: 0.5 packs/day for 15.0 years (7.5 ttl pk-yrs)    Types: Cigarettes    Start date: 05/17/1989    Last attempt to quit: 05/17/2004    Years since quitting: 20.0   Smokeless tobacco: Never   Tobacco comments:    nicotine gum prn  Vaping Use   Vaping status: Never Used  Substance Use Topics   Alcohol use: Yes    Comment: socially   Drug use: No    Most Recent Fall Risk Assessment:    09/11/2022   11:35 AM  Fall Risk   Falls in the past year? 0  Number falls in past yr: 0  Injury with Fall? 0  Risk for fall due to : No Fall Risks  Follow up Falls prevention discussed   Most Recent Anxiety/Depression Screenings:    11/13/2023    9:53 AM 09/11/2022   11:35 AM  PHQ 2/9 Scores  PHQ - 2 Score 0 0     Results:  Studies Obtained And Personally Reviewed By Me:  09/27/2021 Colonoscopy A single erosion in the terminal ileum. - Diverticulosis in the sigmoid colon. Non- bleeding external and internal hemorrhoids. The examination  was otherwise normal. Repeat in 10 years.   Labs:  CBC w/ Differential Lab Results  Component Value Date   WBC 11.9 (H) 05/20/2024   RBC 5.32 05/20/2024   HGB 15.8 05/20/2024   HCT 47.0 05/20/2024   PLT 256 05/20/2024   MCV 88.3 05/20/2024   MCH 29.7 05/20/2024   MCHC 33.6 05/20/2024   RDW 12.7 05/20/2024   MPV 10.0 05/20/2024   LYMPHSABS 1,922 07/14/2021   MONOABS 0.4 08/06/2014   BASOSABS 48  05/20/2024    Comprehensive Metabolic Panel Lab Results  Component Value Date   NA 136 05/20/2024   K 4.1 05/20/2024   CL 102 05/20/2024   CO2 25 05/20/2024   GLUCOSE 104 (H) 05/20/2024   BUN 10 05/20/2024   CREATININE 1.01 05/20/2024   CALCIUM  9.8 05/20/2024   PROT 7.5 05/20/2024   ALBUMIN 4.4 01/25/2015   AST 22 05/20/2024   ALT 21 05/20/2024   ALKPHOS 52 01/25/2015   BILITOT 0.8 05/20/2024   EGFR 89 05/20/2024   GFRNONAA 75 10/20/2019   Lipid Panel  Lab Results  Component Value Date   CHOL 161 05/20/2024   HDL 43 05/20/2024   LDLCALC 93 05/20/2024   TRIG 151 (H) 05/20/2024   A1c Lab Results  Component Value Date   HGBA1C 6.1 (H) 05/20/2024    05/20/2024 PSA 0.44  Assessment & Plan:   Orders Placed This Encounter  Procedures   Flu vaccine trivalent PF, 6mos and older(Flulaval,Afluria,Fluarix,Fluzone)   POCT URINALYSIS DIP (CLINITEK)   Meds ordered this encounter  Medications   fenofibrate  160 MG tablet    Sig: Take 1 tablet (160 mg total) by mouth daily.    Dispense:  90 tablet    Refill:  3   metFORMIN  (GLUCOPHAGE ) 500 MG tablet    Sig: Take 1 tablet (500 mg total) by mouth 2 (two) times daily with a meal.    Dispense:  180 tablet    Refill:  3   meloxicam  (MOBIC ) 15 MG tablet    Sig: One tablet by mouth daily as needed for musculoskeletal pain    Dispense:  30 tablet    Refill:  2   He said that he has a sore throat in morning when he wakes up. He has been experiencing this for about a week. Will call office if symptoms persist.    Hyperlipidemia: treated with Rosuvastatin  40 mg daily, Fenofibrate  160 mg daily. 05/20/2024 Lipid panel Triglycerides 151, Otherwise WNL.   Fenofibrate  refilled.    Impaired glucose tolerance treated with Metformin  500 mg daily. 05/20/2024 HgbA1c 6.1%.    Metformin  500 mg twice daily prescribed.   Anxiety: treated with Trazadone 100 mg as bedtime, clonazepam  0.25-0.5 mg twice daily as needed.   GE Reflux: treated  with Omeprozole 10 mg daily.    He golfs frequently and says that his back gets very sore after a round of golf.     Meloxicam  15 mg as needed for back pain prescribed  Reminded pt he  needs Pneumococcal vaccine at his convenience. Consider Covid booster.  09/27/2021 Colonoscopy A single erosion in the terminal ileum. Diverticulosis in the sigmoid colon. Non- bleeding external and internal hemorrhoids. The examination was otherwise normal. Repeat in 10 years.   Vaccine counseling: Influenza vaccine received today.  Pneumonia vaccine due.   Refilled fenofibrate  for hyperlipidemia     Annual Comprehensive Physical Exam done today including the all of the following: Reviewed patient's Family Medical History Reviewed patient's SDOH and  reviewed tobacco, alcohol, and drug use.  Reviewed and updated list of patient's medical providers Assessment of cognitive impairment was done Assessed patient's functional ability Established a written schedule for health screening services Health Risk Assessent Completed and Reviewed  Discussed health benefits of physical activity, and encouraged him to engage in regular exercise appropriate for his age and condition.    I,Makayla C Reid,acting as a scribe for Ronal JINNY Hailstone, MD.,have documented all relevant documentation on the behalf of Ronal JINNY Hailstone, MD,as directed by  Ronal JINNY Hailstone, MD while in the presence of Ronal JINNY Hailstone, MD.  I, Ronal JINNY Hailstone, MD, have reviewed all documentation for and agree with the above Annual Wellness Visit documentation.  Ronal JINNY Hailstone, MD Internal Medicine 05/22/2024

## 2024-05-20 ENCOUNTER — Other Ambulatory Visit: Payer: Self-pay

## 2024-05-20 DIAGNOSIS — Z0000 Encounter for general adult medical examination without abnormal findings: Secondary | ICD-10-CM

## 2024-05-20 DIAGNOSIS — R7302 Impaired glucose tolerance (oral): Secondary | ICD-10-CM

## 2024-05-20 DIAGNOSIS — F1021 Alcohol dependence, in remission: Secondary | ICD-10-CM

## 2024-05-20 DIAGNOSIS — E782 Mixed hyperlipidemia: Secondary | ICD-10-CM

## 2024-05-21 LAB — CBC WITH DIFFERENTIAL/PLATELET
Absolute Lymphocytes: 1952 {cells}/uL (ref 850–3900)
Absolute Monocytes: 595 {cells}/uL (ref 200–950)
Basophils Absolute: 48 {cells}/uL (ref 0–200)
Basophils Relative: 0.4 %
Eosinophils Absolute: 238 {cells}/uL (ref 15–500)
Eosinophils Relative: 2 %
HCT: 47 % (ref 38.5–50.0)
Hemoglobin: 15.8 g/dL (ref 13.2–17.1)
MCH: 29.7 pg (ref 27.0–33.0)
MCHC: 33.6 g/dL (ref 32.0–36.0)
MCV: 88.3 fL (ref 80.0–100.0)
MPV: 10 fL (ref 7.5–12.5)
Monocytes Relative: 5 %
Neutro Abs: 9068 {cells}/uL — ABNORMAL HIGH (ref 1500–7800)
Neutrophils Relative %: 76.2 %
Platelets: 256 Thousand/uL (ref 140–400)
RBC: 5.32 Million/uL (ref 4.20–5.80)
RDW: 12.7 % (ref 11.0–15.0)
Total Lymphocyte: 16.4 %
WBC: 11.9 Thousand/uL — ABNORMAL HIGH (ref 3.8–10.8)

## 2024-05-21 LAB — COMPREHENSIVE METABOLIC PANEL WITH GFR
AG Ratio: 1.9 (calc) (ref 1.0–2.5)
ALT: 21 U/L (ref 9–46)
AST: 22 U/L (ref 10–35)
Albumin: 4.9 g/dL (ref 3.6–5.1)
Alkaline phosphatase (APISO): 57 U/L (ref 35–144)
BUN: 10 mg/dL (ref 7–25)
CO2: 25 mmol/L (ref 20–32)
Calcium: 9.8 mg/dL (ref 8.6–10.3)
Chloride: 102 mmol/L (ref 98–110)
Creat: 1.01 mg/dL (ref 0.70–1.30)
Globulin: 2.6 g/dL (ref 1.9–3.7)
Glucose, Bld: 104 mg/dL — ABNORMAL HIGH (ref 65–99)
Potassium: 4.1 mmol/L (ref 3.5–5.3)
Sodium: 136 mmol/L (ref 135–146)
Total Bilirubin: 0.8 mg/dL (ref 0.2–1.2)
Total Protein: 7.5 g/dL (ref 6.1–8.1)
eGFR: 89 mL/min/1.73m2 (ref >=60)

## 2024-05-21 LAB — LIPID PANEL
Cholesterol: 161 mg/dL (ref <200)
HDL: 43 mg/dL (ref >=40)
LDL Cholesterol (Calc): 93 mg/dL
Non-HDL Cholesterol (Calc): 118 mg/dL (ref <130)
Total CHOL/HDL Ratio: 3.7 (calc) (ref <5.0)
Triglycerides: 151 mg/dL — ABNORMAL HIGH (ref <150)

## 2024-05-21 LAB — HEMOGLOBIN A1C
Hgb A1c MFr Bld: 6.1 % — ABNORMAL HIGH (ref <5.7)
Mean Plasma Glucose: 128 mg/dL
eAG (mmol/L): 7.1 mmol/L

## 2024-05-21 LAB — PSA: PSA: 0.44 ng/mL (ref <=4.00)

## 2024-05-22 ENCOUNTER — Ambulatory Visit: Payer: Self-pay | Admitting: Internal Medicine

## 2024-05-22 ENCOUNTER — Encounter: Payer: Self-pay | Admitting: Internal Medicine

## 2024-05-22 VITALS — BP 120/88 | HR 112 | Ht 71.0 in | Wt 198.0 lb

## 2024-05-22 DIAGNOSIS — F988 Other specified behavioral and emotional disorders with onset usually occurring in childhood and adolescence: Secondary | ICD-10-CM

## 2024-05-22 DIAGNOSIS — Z0000 Encounter for general adult medical examination without abnormal findings: Secondary | ICD-10-CM | POA: Diagnosis not present

## 2024-05-22 DIAGNOSIS — F419 Anxiety disorder, unspecified: Secondary | ICD-10-CM

## 2024-05-22 DIAGNOSIS — Z23 Encounter for immunization: Secondary | ICD-10-CM | POA: Diagnosis not present

## 2024-05-22 DIAGNOSIS — R7302 Impaired glucose tolerance (oral): Secondary | ICD-10-CM | POA: Diagnosis not present

## 2024-05-22 DIAGNOSIS — E785 Hyperlipidemia, unspecified: Secondary | ICD-10-CM | POA: Diagnosis not present

## 2024-05-22 DIAGNOSIS — F1021 Alcohol dependence, in remission: Secondary | ICD-10-CM

## 2024-05-22 DIAGNOSIS — E782 Mixed hyperlipidemia: Secondary | ICD-10-CM

## 2024-05-22 DIAGNOSIS — K219 Gastro-esophageal reflux disease without esophagitis: Secondary | ICD-10-CM | POA: Diagnosis not present

## 2024-05-22 DIAGNOSIS — Z8659 Personal history of other mental and behavioral disorders: Secondary | ICD-10-CM

## 2024-05-22 LAB — POCT URINALYSIS DIP (CLINITEK)
Bilirubin, UA: NEGATIVE
Blood, UA: NEGATIVE
Glucose, UA: NEGATIVE mg/dL
Ketones, POC UA: NEGATIVE mg/dL
Leukocytes, UA: NEGATIVE
Nitrite, UA: NEGATIVE
POC PROTEIN,UA: NEGATIVE
Spec Grav, UA: 1.01 (ref 1.010–1.025)
Urobilinogen, UA: 0.2 U/dL
pH, UA: 6.5 (ref 5.0–8.0)

## 2024-05-22 MED ORDER — METFORMIN HCL 500 MG PO TABS: 500.0000 mg | ORAL_TABLET | Freq: Two times a day (BID) | ORAL | 3 refills | Status: AC

## 2024-05-22 MED ORDER — MELOXICAM 15 MG PO TABS
ORAL_TABLET | ORAL | 2 refills | Status: AC
Start: 2024-05-22 — End: ?

## 2024-05-22 MED ORDER — FENOFIBRATE 160 MG PO TABS: 160.0000 mg | ORAL_TABLET | Freq: Every day | ORAL | 3 refills | Status: AC

## 2024-06-06 ENCOUNTER — Encounter: Payer: Self-pay | Admitting: Internal Medicine

## 2024-06-06 NOTE — Patient Instructions (Addendum)
 It was a pleasure to see you today. Would like to see Hgb AIC lees than 6%. Begin metformin . Suggest rechecking Hgb AIC in March. Otherwise return for health maintenance exam in one year or as needed.Flu vaccine given. Have pneumococcal vaccine in the near future. Consider Covid booster.

## 2024-07-15 ENCOUNTER — Encounter: Payer: Self-pay | Admitting: Internal Medicine

## 2025-05-26 ENCOUNTER — Other Ambulatory Visit

## 2025-05-29 ENCOUNTER — Encounter: Admitting: Internal Medicine
